# Patient Record
Sex: Female | Born: 1975 | Race: White | Hispanic: Yes | Marital: Married | Smoking: Never smoker
Health system: Southern US, Community
[De-identification: ages and names within clinical notes are randomized; demographics above are authoritative.]

## PROBLEM LIST (undated history)

## (undated) DIAGNOSIS — I10 Essential (primary) hypertension: Secondary | ICD-10-CM

## (undated) DIAGNOSIS — R140 Abdominal distension (gaseous): Secondary | ICD-10-CM

## (undated) DIAGNOSIS — D649 Anemia, unspecified: Secondary | ICD-10-CM

## (undated) DIAGNOSIS — K2970 Gastritis, unspecified, without bleeding: Secondary | ICD-10-CM

## (undated) HISTORY — DX: Abdominal distension (gaseous): R14.0

## (undated) HISTORY — PX: CHOLECYSTECTOMY: SHX55

## (undated) HISTORY — DX: Anemia, unspecified: D64.9

## (undated) HISTORY — DX: Essential (primary) hypertension: I10

---

## 2011-11-20 DIAGNOSIS — R079 Chest pain, unspecified: Secondary | ICD-10-CM

## 2013-04-16 ENCOUNTER — Encounter (HOSPITAL_COMMUNITY): Payer: Self-pay | Admitting: Emergency Medicine

## 2013-04-16 ENCOUNTER — Emergency Department (HOSPITAL_COMMUNITY)
Admission: EM | Admit: 2013-04-16 | Discharge: 2013-04-16 | Disposition: A | Discharge status: Home / Self Care | Payer: Self-pay | Attending: Emergency Medicine | Admitting: Emergency Medicine

## 2013-04-16 ENCOUNTER — Emergency Department (HOSPITAL_COMMUNITY): Payer: Self-pay

## 2013-04-16 DIAGNOSIS — R141 Gas pain: Secondary | ICD-10-CM | POA: Insufficient documentation

## 2013-04-16 DIAGNOSIS — Z79899 Other long term (current) drug therapy: Secondary | ICD-10-CM | POA: Insufficient documentation

## 2013-04-16 DIAGNOSIS — Z9089 Acquired absence of other organs: Secondary | ICD-10-CM | POA: Insufficient documentation

## 2013-04-16 DIAGNOSIS — R142 Eructation: Secondary | ICD-10-CM | POA: Insufficient documentation

## 2013-04-16 DIAGNOSIS — R143 Flatulence: Secondary | ICD-10-CM

## 2013-04-16 DIAGNOSIS — K3189 Other diseases of stomach and duodenum: Secondary | ICD-10-CM | POA: Insufficient documentation

## 2013-04-16 DIAGNOSIS — R1013 Epigastric pain: Secondary | ICD-10-CM

## 2013-04-16 HISTORY — DX: Gastritis, unspecified, without bleeding: K29.70

## 2013-04-16 LAB — CBC WITH DIFFERENTIAL/PLATELET
Basophils Absolute: 0 10*3/uL (ref 0.0–0.1)
Basophils Relative: 0 % (ref 0–1)
EOS PCT: 4 % (ref 0–5)
Eosinophils Absolute: 0.2 10*3/uL (ref 0.0–0.7)
HCT: 31 % — ABNORMAL LOW (ref 36.0–46.0)
HEMOGLOBIN: 9.7 g/dL — AB (ref 12.0–15.0)
LYMPHS ABS: 1.2 10*3/uL (ref 0.7–4.0)
LYMPHS PCT: 22 % (ref 12–46)
MCH: 25.2 pg — AB (ref 26.0–34.0)
MCHC: 31.3 g/dL (ref 30.0–36.0)
MCV: 80.5 fL (ref 78.0–100.0)
Monocytes Absolute: 0.4 10*3/uL (ref 0.1–1.0)
Monocytes Relative: 7 % (ref 3–12)
NEUTROS PCT: 67 % (ref 43–77)
Neutro Abs: 3.6 10*3/uL (ref 1.7–7.7)
PLATELETS: 333 10*3/uL (ref 150–400)
RBC: 3.85 MIL/uL — AB (ref 3.87–5.11)
RDW: 16.5 % — ABNORMAL HIGH (ref 11.5–15.5)
WBC: 5.3 10*3/uL (ref 4.0–10.5)

## 2013-04-16 LAB — COMPREHENSIVE METABOLIC PANEL
ALT: 11 U/L (ref 0–35)
AST: 17 U/L (ref 0–37)
Albumin: 4 g/dL (ref 3.5–5.2)
Alkaline Phosphatase: 60 U/L (ref 39–117)
BUN: 8 mg/dL (ref 6–23)
CO2: 22 meq/L (ref 19–32)
Calcium: 8.7 mg/dL (ref 8.4–10.5)
Chloride: 104 mEq/L (ref 96–112)
Creatinine, Ser: 0.55 mg/dL (ref 0.50–1.10)
GFR calc Af Amer: >90 mL/min (ref >90)
Glucose, Bld: 90 mg/dL (ref 70–99)
POTASSIUM: 3.9 meq/L (ref 3.7–5.3)
Sodium: 139 mEq/L (ref 137–147)
Total Bilirubin: 0.3 mg/dL (ref 0.3–1.2)
Total Protein: 7.3 g/dL (ref 6.0–8.3)

## 2013-04-16 LAB — LIPASE, BLOOD: LIPASE: 32 U/L (ref 11–59)

## 2013-04-16 MED ORDER — SUCRALFATE 1 G PO TABS: 1.0000 g | ORAL_TABLET | Freq: Three times a day (TID) | ORAL | Status: DC

## 2013-04-16 MED ORDER — PANTOPRAZOLE SODIUM 40 MG PO TBEC: 40.0000 mg | DELAYED_RELEASE_TABLET | Freq: Once | ORAL | Status: DC

## 2013-04-16 MED ORDER — SIMETHICONE 40 MG/0.6ML PO SUSP
40.0000 mg | Freq: Once | ORAL | Status: AC
  Administered 2013-04-16: 40 mg via ORAL
  Filled 2013-04-16: qty 0.6

## 2013-04-16 MED ORDER — RANITIDINE HCL 150 MG PO CAPS: 150.0000 mg | ORAL_CAPSULE | Freq: Every day | ORAL | Status: DC

## 2013-04-16 NOTE — Discharge Instructions (Signed)
Follow up with [facility]. Call office and make appointment as soon as you can. Take medications as directed. Return to ED should you develop blood in your stools, become progressively weak, dizziness, or fatigued.    Emergency Department Resource Guide 1) Find a Doctor and Pay Out of Pocket Although you won't have to find out who is covered by your insurance plan, it is a good idea to ask around and get recommendations. You will then need to call the office and see if the doctor you have chosen will accept you as a new patient and what types of options they offer for patients who are self-pay. Some doctors offer discounts or will set up payment plans for their patients who do not have insurance, but you will need to ask so you aren't surprised when you get to your appointment.  2) Contact Your Local Health Department Not all health departments have doctors that can see patients for sick visits, but many do, so it is worth a call to see if yours does. If you don't know where your local health department is, you can check in your phone book. The CDC also has a tool to help you locate your state's health department, and many state websites also have listings of all of their local health departments.  3) Find a Walk-in Clinic If your illness is not likely to be very severe or complicated, you may want to try a walk in clinic. These are popping up all over the country in pharmacies, drugstores, and shopping centers. They're usually staffed by nurse practitioners or physician assistants that have been trained to treat common illnesses and complaints. They're usually fairly quick and inexpensive. However, if you have serious medical issues or chronic medical problems, these are probably not your best option.  No Primary Care Doctor: - Call Health Connect at  [phone] - they can help you locate a primary care doctor that  accepts your insurance, provides certain services, etc. - Physician  Referral Service- [phone]  Chronic Pain Problems: Organization         Address  Phone   Notes  [facility]  [phone] Patients need to be referred by their primary care doctor.   Medication Assistance: Organization         Address  Phone   Notes  [facility] Medication [facility] [address] [place], [place] [id number] [phone] --Must be a resident of [place] -- Must have NO insurance coverage whatsoever (no Medicaid/ Medicare, etc.) -- The pt. MUST have a primary care doctor that directs their care regularly and follows them in the community   MedAssist  [phone]   [employer]  [phone]    Agencies that provide inexpensive medical care: Organization         Address  Phone   Notes  [facility]  [phone]   [facility]    [phone]   [facility] [address] [place], [place] [address] [phone]   [facility] [address], [place] [phone]   [facility]    [phone]   [facility]    [phone]   [facility]  [address], [place] Phone:  [phone], Fax:  [phone] Hours of Operation:  9 am - 6 pm, M-F.  Also accepts Medicaid/Medicare and self-pay.  [facility] for [place], [address], [place] Phone: [phone], Fax: [phone]. Hours of Operation:  8:30 am - 5:30 pm, M-F.  Also accepts Medicaid and self-pay.  [facility] [place] [address] [place] Phone: [phone]   [place], [place], [place] [phone], Ext. [phone] Mondays & Thursdays: 7-9 AM.  First 15 patients are seen on a first come, first serve basis.    [place] Providers:  Organization         Address  Phone   Notes  [facility] [address], [place] [phone] Also accepts self-pay patients.  [facility] [address], [place]  [phone]   [place], [address], [place] [phone]   [facility] [address] [place] [phone]   Lucianne Lei [address], [place]   [phone] Only accepts [place] Access [place] patients after they have their name applied to their card.   Self-Pay (no insurance) in [facility]:  Organization         Address  Phone   Notes  Sickle Cell Patients, [facility] [place] [phone]   [facility] [place] [phone]   [facility] Interior  [place], [place], [place] [phone]   [facility]/Dr. Osei-Bonsu  [address] [place] [address], [place] [phone] Phone number for both [place] and [place] locations is the same.  [facility] [address], [place] [phone]   [facility] [address] [place] or [address] [phone]   [facility] [address], [place] [phone], phone; [phone], fax Sees patients 1st and 3rd Saturday of every month.  Must not qualify for public or private insurance (i.e. Medicaid, Medicare, [place] Health Choice, Veterans' Benefits)  Household income should be no more than 200% of the poverty level The clinic cannot treat you if you are pregnant or think you are pregnant  Sexually transmitted diseases are not treated at the clinic.    Dental Care: Organization         Address  Phone  Notes  [facility] Department of [facility] [phone] Accepts children up to age 7 who are enrolled  in [place] or [place]; pregnant women with a Medicaid card; and children who have applied for Medicaid or [place] Health Choice, but were declined, whose parents can pay a reduced fee at time of service.  [facility] Department of [facility]  [address], [place] [phone] Accepts children up to age 36 who are enrolled in [place] or [place]; pregnant women with a Medicaid card; and children who have applied for Medicaid or [place] Health Choice, but were declined, whose parents can pay a reduced fee at time of service.  [facility] Adult [facility] Access PROGRAM  [place] [phone] Patients are seen by appointment only. Walk-ins are not accepted. [place] will see patients 32 years of age and older. Monday - Tuesday (8am-5pm) Most Wednesdays (8:30-5pm) $30 per visit, cash  only  [facility] Adult Dental Access PROGRAM  [address], [facility] [phone] Patients are seen by appointment only. Walk-ins are not accepted. [place] will see patients 68 years of age and older. One Wednesday Evening (Monthly: Volunteer Based).  $30 per visit, cash only  [place]  [phone] for adults; Children under age 4, call Graduate Pediatric Dentistry at [phone]. Children aged 86-14, please call [phone] to request a pediatric application.  Dental services are provided in all areas of dental care including fillings, crowns and bridges, complete and partial dentures, implants, gum treatment, root canals, and extractions. Preventive care is also provided. Treatment is provided to both adults and children. Patients are selected via a lottery and there is often a waiting list.   [facility] [address], [place]  [phone] [email or web address]   [facility] [address] [place], [place] [phone], Ext. [phone] Second and Fourth Thursday of each month, opens at  6:30 AM; Clinic ends at 9 AM.  Patients are seen on a first-come first-served basis, and a limited number are seen during each clinic.   [facility]  [address] Hillard Danker [place], [place] [phone]   Eligibility Requirements You must have lived in [place], [place], or [place] counties for at least the last three months.   You cannot be eligible for state or federal sponsored [employer], including [employer], [place], or [employer].   You generally cannot be eligible for healthcare insurance through your employer.    How to apply: Eligibility screenings are held every Tuesday and Wednesday afternoon from 1:00 pm until 4:00 pm. You do not need an appointment for the interview!  [facility] [address] [place], [place]   [place]  [place] Department  [place]  [phone]    Behavioral Health Resources in the Community: Intensive Outpatient Programs Organization         Address  Phone  Notes  [place]. [address], [place], [place] [phone]   [facility] Outpatient [address] [place], [place]   ADS: Alcohol & Drug Svcs [address] [place], [place]   [place] [address],  [place], [place] or [phone]   Substance Abuse Resources Organization         Address  Phone  Notes  Alcohol and Drug Services  [phone]   [place]  [phone]   The [place]   Chinita Pester  [phone]   Residential & Outpatient Substance Abuse Program  [phone]   Psychological Services Organization         Address  Phone  Notes  [facility]  [place]  [phone]   [place] [address], [place] or [phone]    Mobile Crisis Teams Organization         Address  Phone  Notes  [facility], Mobile Crisis Care Unit  [phone]   [facility]ive Psychotherapeutic Services  [address] [place], [place]   [place] Levels [address], [place] [phone]    Self-Help/Support Groups Organization         Address  Phone  Notes  [facility] of [place] - variety of support groups  [place] Call for more information  Narcotics Anonymous (NA), [facility]aring [facility] [address], [employer] [place]  2 meetings at this location   Special educational needs teacher         Address  Phone  Notes  ASAP Residential Treatment [place],    [place]  [phone]   [facility]  [address], [place] [phone], [place], [place]   [place], [place] [phone] Admissions: 8am-3pm M-F  Incentives Substance [place] [address],    [place], [place] [phone]   [facility] [address] [place], [place], [place]   [facility] [address],  [place], [place]   Insight Programs - Intensive Outpatient [address] Kristeen [address], [place], [place]   [facility] ([place].) [place].,  [place], [place] [phone] or [phone]   [facility]idential Treatment Services (RTS) [address] [place], [place] Accepts Medicaid  [place] [address]  [place] [phone] Substance Abuse/Addiction Treatment   [facility] Organization         Address  Phone  Notes  [facility]  [phone]   Domenic Schwab, PhD [address] Arlis Porta [place], [place]   [phone] or [phone]   [place], [place] [phone]   [facility]  [address] [place], [place] [phone] Insurance/Medicaid/sponsorship through [facility] and Families [address] [place]                                    [place], [place] [phone] [place] [address], [place] [phone]    Dr. Adele Schilder  [phone]   [facility] Dept. 1) [address], [place] 2) [place] 3)  [place] 65, [place] [phone]  [phone]   [place] [phone] or [phone] (After Hours)

## 2013-04-16 NOTE — ED Provider Notes (Signed)
CSN: [id number]     Arrival date & time 04/16/13  8295 History   First MD Initiated Contact with Patient 04/16/13 1004     Chief Complaint  Patient presents with  . Abdominal Pain   (Consider location/radiation/quality/duration/timing/severity/associated sxs/prior Treatment) Patient is a 38 y.o. female presenting with abdominal pain. The history is provided by the patient. The history is limited by a language barrier. A language interpreter was used.  Abdominal Pain Pain location:  LUQ Pain quality: bloating   Pain radiates to:  Does not radiate Pain severity:  No pain Onset quality:  Gradual Duration: 3 months. Progression:  Unchanged Relieved by:  Nothing Associated symptoms: belching   Associated symptoms: no chest pain, no chills, no diarrhea, no dysuria, no fever, no hematochezia, no hematuria, no melena, no nausea, no shortness of breath and no vomiting   Patient admits to being dx with Gastritis by her doctor and started on Omeprazole. States symptoms have not improved. Denies any pain just complains of bloating and repetitive belching and fullness in her stomach. Denies any other medication use. Patient PMH significant for Cholecystectomy.  Past Medical History  Diagnosis Date  . Gastritis    History reviewed. No pertinent past surgical history. No family history on file. History  Substance Use Topics  . Smoking status: Never Smoker   . Smokeless tobacco: Never Used  . Alcohol Use: Yes     Comment: occasinally   OB History   Grav Para Term Preterm Abortions TAB SAB Ect Mult Living                 Review of Systems  Constitutional: Negative for fever and chills.  Respiratory: Negative for shortness of breath.   Cardiovascular: Negative for chest pain.  Gastrointestinal: Positive for abdominal distention. Negative for nausea, vomiting, abdominal pain, diarrhea, melena and hematochezia.  Genitourinary: Negative for dysuria and hematuria.  All other systems reviewed  and are negative.    Allergies  Other  Home Medications   Current Outpatient Rx  Name  Route  Sig  Dispense  Refill  . omeprazole (PRILOSEC) 40 MG capsule   Oral   Take 40 mg by mouth daily.         . ranitidine (ZANTAC) 150 MG capsule   Oral   Take 1 capsule (150 mg total) by mouth daily.   30 capsule   0   . sucralfate (CARAFATE) 1 G tablet   Oral   Take 1 tablet (1 g total) by mouth 4 (four) times daily -  with meals and at bedtime.   90 tablet   0    BP 156/85  Pulse 91  Temp(Src) 97.9 F (36.6 C)  Resp 18  Wt 143 lb (64.864 kg)  SpO2 99%  LMP 04/16/2013 Physical Exam  Nursing note and vitals reviewed. Constitutional: She is oriented to person, place, and time. She appears well-developed and well-nourished. No distress.  HENT:  Head: Normocephalic and atraumatic.  Eyes: Conjunctivae and EOM are normal. No scleral icterus.  Neck: Normal range of motion. Neck supple.  Cardiovascular: Normal rate and regular rhythm.  Exam reveals no gallop and no friction rub.   No murmur heard. Pulmonary/Chest: Effort normal and breath sounds normal. No respiratory distress. She has no wheezes. She has no rales.  Abdominal: Soft. Bowel sounds are normal. She exhibits no distension and no mass. There is no tenderness. There is no rigidity, no rebound, no guarding, no tenderness at McBurney's point and negative Murphy's  sign.  Scars present in RUQ consistent with prior laproscopic Cholecystectomy.   Musculoskeletal: Normal range of motion. She exhibits no edema.  Neurological: She is alert and oriented to person, place, and time.  Skin: Skin is warm and dry. She is not diaphoretic.  Psychiatric: She has a normal mood and affect. Her behavior is normal.    ED Course  Procedures (including critical care time) Labs Review Labs Reviewed  CBC WITH DIFFERENTIAL - Abnormal; Notable for the following:    RBC 3.85 (*)    Hemoglobin 9.7 (*)    HCT 31.0 (*)    MCH 25.2 (*)     RDW 16.5 (*)    All other components within normal limits  COMPREHENSIVE METABOLIC PANEL  LIPASE, BLOOD   Imaging Review Dg Abd Acute W/chest  04/16/2013   CLINICAL DATA:  Dyspepsia, bloating  EXAM: ACUTE ABDOMEN SERIES (ABDOMEN 2 VIEW & CHEST 1 VIEW)  COMPARISON:  None.  FINDINGS: There is no evidence of dilated bowel loops or free intraperitoneal air. No radiopaque calculi or other significant radiographic abnormality is seen. Heart size and mediastinal contours are within normal limits. Both lungs are clear. Previous cholecystectomy noted.  IMPRESSION: Negative abdominal radiographs.  No acute cardiopulmonary disease.   Electronically Signed   By: Ruel Favorsrevor  Shick M.D.   On: 04/16/2013 12:11    EKG Interpretation   None       MDM   1. Dyspepsia    Patient does not appear to have an acute abdomen. Plan films not consistent with obstruction or acute cardiopulmonary disease.   Patient anemic with hgb of 9.7. No prior labs available for comparison. Patient asymptomatic currently. Additional labs unremarkable. Cannot rule out peptic ulcer at this time. Plan to treat patient for ulcer until can be seen by GI.   Discussed labs, and exam findings with patient. Advised follow up with [facility] ASAP. Patient provided resource guide for reference. Recommend return to ED should patient  develop blood in stools, become progressively weak, dizziness, or fatigued. Interpreter phone used to ensure patient's understanding of instructions. Patient agrees with plan. Provided informational handout in spanish. Discharged in good condition.   Meds given in ED:  Medications  simethicone (MYLICON) 40 MG/0.6ML suspension 40 mg (40 mg Oral Given 04/16/13 1113)    Discharge Medication List as of 04/16/2013 12:33 PM    START taking these medications   Details  ranitidine (ZANTAC) 150 MG capsule Take 1 capsule (150 mg total) by mouth daily., Starting 04/16/2013, Until Discontinued, Print    sucralfate  (CARAFATE) 1 G tablet Take 1 tablet (1 g total) by mouth 4 (four) times daily -  with meals and at bedtime., Starting 04/16/2013, Until Discontinued, Print            Rudene AndaJacob Gray Deetya Drouillard, PA-C 04/16/13 2050

## 2013-04-16 NOTE — ED Notes (Signed)
Patient is experiencing 'abdominal swelling' Patient reports that it is not painful. Patient reports that this feeling makes her not want to eat because when she does she has a lot of gas and belching. Patient denies n/v/d. Patient states she has a hx of bacterial gastritis 8 years ago. Denies hematochezia.

## 2013-04-17 NOTE — ED Provider Notes (Signed)
Medical screening examination/treatment/procedure(s) were performed by non-physician practitioner and as supervising physician I was immediately available for consultation/collaboration.     Geoffery Lyonsouglas Teshaun Olarte, MD 04/17/13 [phone]

## 2013-04-25 ENCOUNTER — Encounter: Payer: Self-pay | Admitting: Gastroenterology

## 2013-05-02 ENCOUNTER — Other Ambulatory Visit (INDEPENDENT_AMBULATORY_CARE_PROVIDER_SITE_OTHER): Payer: Self-pay

## 2013-05-02 ENCOUNTER — Encounter: Payer: Self-pay | Admitting: Gastroenterology

## 2013-05-02 ENCOUNTER — Ambulatory Visit (INDEPENDENT_AMBULATORY_CARE_PROVIDER_SITE_OTHER): Payer: Self-pay | Admitting: Gastroenterology

## 2013-05-02 VITALS — BP 108/78 | HR 84 | Ht 58.25 in | Wt 140.5 lb

## 2013-05-02 DIAGNOSIS — R143 Flatulence: Secondary | ICD-10-CM

## 2013-05-02 DIAGNOSIS — R142 Eructation: Secondary | ICD-10-CM

## 2013-05-02 DIAGNOSIS — R141 Gas pain: Secondary | ICD-10-CM

## 2013-05-02 DIAGNOSIS — D649 Anemia, unspecified: Secondary | ICD-10-CM

## 2013-05-02 DIAGNOSIS — R140 Abdominal distension (gaseous): Secondary | ICD-10-CM

## 2013-05-02 DIAGNOSIS — R1012 Left upper quadrant pain: Secondary | ICD-10-CM

## 2013-05-02 LAB — FOLATE

## 2013-05-02 LAB — FERRITIN: Ferritin: 2.3 ng/mL — ABNORMAL LOW (ref 10.0–291.0)

## 2013-05-02 LAB — IBC PANEL
Iron: 23 ug/dL — ABNORMAL LOW (ref 42–145)
Saturation Ratios: 5.3 % — ABNORMAL LOW (ref 20.0–50.0)
Transferrin: 308.9 mg/dL (ref 212.0–360.0)

## 2013-05-02 LAB — VITAMIN B12: Vitamin B-12: 76 pg/mL — ABNORMAL LOW (ref 211–911)

## 2013-05-02 MED ORDER — PANTOPRAZOLE SODIUM 40 MG PO TBEC: 40.0000 mg | DELAYED_RELEASE_TABLET | Freq: Every day | ORAL | Status: DC

## 2013-05-02 NOTE — Patient Instructions (Addendum)
Your physician has requested that you go to the basement for the following lab work before leaving today: Anemia panel.  Stop taking your omeprazole.   We have sent the following medications to your pharmacy for you to pick up at your convenience: pantoprazole.  You have been given a Low gas diet.   Use over the counter Gas-x four times a day as needed for gas and bloating.   Dieta para el reflujo gastroesofgico - Adultos  (Diet for Gastroesophageal Reflux Disease, Adult)  El reflujo (reflujo cido) ocurre cuando el cido del estmago pasa al esfago. Cuando el cido entra en contacto con el esfago, el cido provoca dolor e irritacin (inflamacin) en el esfago. Cuando el reflujo ocurre a menudo o es tan grave que causa dao en el esfago, se denomina enfermedad por reflujo gastroesofgico (ERGE). La terapia nutricional puede ayudar a Acupuncturistaliviar el malestar de la [place]ERGE.  ALIMENTOS O BEBIDAS QUE DEBE EVITAR O LIMITAR   Fumar o consumir tabaco. La nicotina es uno de los estimulantes ms potentes en la produccin de cido en el tracto gastrointestinal.  Caf y t negro con cafena o descafeinado.  Gaseosas comunes o bajas caloras o bebidas energizantes (las gaseosas sin cafena estn permitidas).   Especias picantes, como la pimienta negra, pimienta blanca, pimienta roja, pimienta de cayena, curry en polvo,y [place]ub[place]ile en polvo.  Menta y mentol.  Chocolate.  Alimentos con alto contenido de grasas, incluyendo las carnes y comidas fritas. El agregado de Morsegrasas extra, por ejemplo aceite, Brightwoodmanteca, aderezo para ensaladas y nueces. Limite estos alimentos a menos de 8 cucharaditas por da.  Las frutas y verduras si no son toleradas, tales como frutas ctricas o tomates.  El alcohol.  Todo alimento que agrave el trastorno. Si tiene dudas relacionadas con la dieta, comunquese con el profesional que lo asiste o con un nutricionista matriculado.  OTROS FACTORES QUE PUEDEN ALIVIAR EL ERGE SON:    Comer lentamente, en un clima distendido.  Hacer 5 o 6 comidas pequeas por da en vez de tres grandes.  Suprimir por un CBS Corporationtiempo los alimentos que causen problemas.  No acostarse hasta despus de 3 horas de haber comido.  Mantener la cabeza elevada 6 a 9 pulgadas (15 a 23 cm) usando una cua de espuma o bloques debajo de las patas de la cama. Si permanece en una postura plana har empeorar los sntomas.  Mantngase fsicamente activo. Perder peso puede ser de ayuda para reducir el Asbury [employer] Groupreflujo en los adultos obesos o con sobrepeso.  Use ropas sueltas. EJEMPLO DE UN PLAN DE ALIMENTACIN  Este plan de alimentacin consiste en aproximadamente 2 000 caloras, segn las guas de alimentacin de https://www.bernard.org/ChooseMyPlate.gov.  Desayuno   taza de avena cocida.  1 porcin de fresas.  1 taza de PPG Industriesleche descremada.  1 oz de almendras. Colacin  1 taza de rebanadas de pepino.  6 oz de yogur (elaborado con WPS Resourcesleche con bajo contenido de grasas o descremada). Almuerzo:  2 rebanada de pan integral.  2 oz de rebanadas de pavo.  2 cucharaditas de mayonesa.  1 taza de arndanos.  1 taza de guisantes. Colacin  6 crackers integrales.  1 oz ( 28 g) de queso en hebras. Cena   taza de arroz integral.  1 taza de vegetales variados.  1 cucharadita de aceite de oliva.  3 oz ( 84 g) de pescado grill. Document Released: 01/01/2005 Document Revised: 06/16/2011 [facility] Patient Information 2014 [employer], [place].  cc: [facility]

## 2013-05-02 NOTE — Progress Notes (Signed)
    History of Present Illness: This is a 38 year old Spanish speaking female here with an interpreter. All history is obtained through the interpreter. She [place] care physician in [place], probably the [facility]. She relates a 2 month history of left upper quadrant discomfort with gas and belching. She also has frequent heartburn. She was placed on omeprazole and her heartburn was relieved however her gas belching and mild left upper quadrant discomfort have not improved. She states she had similar problems 8 years ago when living in [place]. She states she had an endoscopy and H. pylori was diagnosed and treated. She notes a 10 pound weight loss as she is clearly eating less with her current symptoms. She was recently evaluated at [facility] blood work was unremarkable except for anemia with a hemoglobin of 9.7. Denies constipation, diarrhea, change in stool caliber, melena, hematochezia, nausea, vomiting, dysphagia, chest pain.  Current Medications, Allergies, Past Medical History, Past Surgical History, Family History and Social History were reviewed in [facility] electronic medical record.  Physical Exam: General: Well developed , well nourished, no acute distress Head: Normocephalic and atraumatic Eyes:  sclerae anicteric, EOMI Ears: Normal auditory acuity Mouth: No deformity or lesions Lungs: Clear throughout to auscultation Heart: Regular rate and rhythm; no murmurs, rubs or bruits Abdomen: Soft, minimal left upper quadrant tenderness without rebound or guarding and non distended. No masses, hepatosplenomegaly or hernias noted. Normal Bowel sounds Musculoskeletal: Symmetrical with no gross deformities  Pulses:  Normal pulses noted Extremities: No clubbing, cyanosis, edema or deformities noted Neurological: Alert oriented x 4, grossly nonfocal Psychological:  Alert and cooperative. Normal mood and affect  Assessment and Recommendations:  1. LUQ  discomfort, gas, belching, GERD. Changed to pantoprazole 40 mg daily. Begin standard antireflux measures. Begin a low gas diet. Begin Gas-X 4 times a day when necessary. If her symptoms do not resolve consider further evaluation with abdominal imaging, stool Hemoccults and upper endoscopy.  2. Anemia. Iron, TIBC, ferritin, B12 and folate. Further followup with her primary physician.

## 2013-05-06 ENCOUNTER — Other Ambulatory Visit: Payer: Self-pay

## 2013-05-06 DIAGNOSIS — D649 Anemia, unspecified: Secondary | ICD-10-CM

## 2013-05-10 ENCOUNTER — Encounter: Payer: Self-pay | Admitting: Gastroenterology

## 2013-05-18 ENCOUNTER — Ambulatory Visit: Payer: Self-pay | Admitting: Gastroenterology

## 2013-05-18 ENCOUNTER — Encounter: Payer: Self-pay | Admitting: Gastroenterology

## 2013-05-18 ENCOUNTER — Telehealth: Payer: Self-pay | Admitting: Gastroenterology

## 2013-05-18 NOTE — Telephone Encounter (Signed)
MAILED LETTER °

## 2013-05-18 NOTE — Telephone Encounter (Signed)
Pt was a no show

## 2013-06-14 ENCOUNTER — Ambulatory Visit (AMBULATORY_SURGERY_CENTER): Payer: Self-pay

## 2013-06-14 VITALS — Ht 59.0 in | Wt 136.8 lb

## 2013-06-14 DIAGNOSIS — R141 Gas pain: Secondary | ICD-10-CM

## 2013-06-14 DIAGNOSIS — R143 Flatulence: Secondary | ICD-10-CM

## 2013-06-14 DIAGNOSIS — R140 Abdominal distension (gaseous): Secondary | ICD-10-CM

## 2013-06-14 DIAGNOSIS — R142 Eructation: Secondary | ICD-10-CM

## 2013-06-14 DIAGNOSIS — D649 Anemia, unspecified: Secondary | ICD-10-CM

## 2013-06-14 MED ORDER — MOVIPREP 100 G PO SOLR: ORAL | Status: DC

## 2013-06-14 NOTE — Progress Notes (Signed)
Spoke with Denny PeonErin, pharmacist) at [facility] regarding side effects with Moviprep and pt being allergic to Zantac.Per Denny PeonErin, pt is able to take Moviprep. No warnings noted with the 2 medications being taken.  Amil Amen (interpretor) was with the pt at the pre-visit and explained prep instructions and helped answer her questions. An interpretor and driver will be with the pt on 06/28/13

## 2013-06-15 ENCOUNTER — Other Ambulatory Visit: Payer: Self-pay

## 2013-06-15 DIAGNOSIS — D649 Anemia, unspecified: Secondary | ICD-10-CM

## 2013-06-16 LAB — HEMOCCULT SLIDES (X 3 CARDS)
Fecal Occult Blood: NEGATIVE
OCCULT 1: NEGATIVE
OCCULT 2: NEGATIVE
OCCULT 3: NEGATIVE
OCCULT 4: NEGATIVE
OCCULT 5: NEGATIVE

## 2013-06-28 ENCOUNTER — Ambulatory Visit (AMBULATORY_SURGERY_CENTER): Payer: Self-pay | Admitting: Gastroenterology

## 2013-06-28 ENCOUNTER — Encounter: Payer: Self-pay | Admitting: Gastroenterology

## 2013-06-28 VITALS — BP 130/85 | HR 77 | Temp 99.5°F | Resp 17

## 2013-06-28 DIAGNOSIS — D649 Anemia, unspecified: Secondary | ICD-10-CM

## 2013-06-28 DIAGNOSIS — D509 Iron deficiency anemia, unspecified: Secondary | ICD-10-CM

## 2013-06-28 DIAGNOSIS — K2940 Chronic atrophic gastritis without bleeding: Secondary | ICD-10-CM

## 2013-06-28 DIAGNOSIS — K219 Gastro-esophageal reflux disease without esophagitis: Secondary | ICD-10-CM

## 2013-06-28 DIAGNOSIS — R1012 Left upper quadrant pain: Secondary | ICD-10-CM

## 2013-06-28 MED ORDER — SODIUM CHLORIDE 0.9 % IV SOLN: 500.0000 mL | INTRAVENOUS | Status: DC

## 2013-06-28 NOTE — Op Note (Signed)
[facility] [address] [place], [id number]   ENDOSCOPY PROCEDURE REPORT  PATIENT: Alexandra CorkRodrigues, Alexandra  MR#: [id number] BIRTHDATE: 06/25/1975 , 37  yrs. old GENDER: Female ENDOSCOPIST: Meryl DareMalcolm T Stark, MD, [facility] REFERRED BY:  Cathren LaineKevin Steinl, M.D. PROCEDURE DATE:  06/28/2013 PROCEDURE:  EGD w/ biopsy ASA CLASS:     Class II INDICATIONS:  abdominal pain in upper left quadrant.   Iron deficiency anemia. MEDICATIONS: MAC sedation, administered by CRNA and propofol (Diprivan) 200mg  IV TOPICAL ANESTHETIC: none DESCRIPTION OF PROCEDURE: After the risks benefits and alternatives of the procedure were thoroughly explained, informed consent was obtained.  The LB [id number] endoscope was introduced through the mouth and advanced to the second portion of the duodenum. Without limitations.  The instrument was slowly withdrawn as the mucosa was fully examined.  ESOPHAGUS: The mucosa of the esophagus appeared normal. STOMACH: Atrophic gastritis was found in the gastric body and gastric fundus.  Multiple biopsies were performed.   The stomach otherwise appeared normal. DUODENUM: The duodenal mucosa showed no abnormalities in the bulb and second portion of the duodenum.  Retroflexed views revealed no abnormalities.     The scope was then withdrawn from the patient and the procedure completed.  COMPLICATIONS: There were no complications.  ENDOSCOPIC IMPRESSION: 1.    Atrophic gastritis in the gastric body and gastric fundus; multiple biopsies 2 .   The EGD otherwise appeared normal  RECOMMENDATIONS: 1.  Anti-reflux regimen 2.  Schedule a colonoscopy. Miralax split dose prep with Reglan 10 mg 30 min before both doses 3.  Schedule abdominal ultrasound 4.  Await pathology results 5.  Carafate liquid 1 g tid, 3 months of refills, Bentylk 10 mg tid, 3 months of refills, continue Gas-X qid  eSigned:  Meryl DareMalcolm T Stark, MD, [facility] 06/28/2013 2:31 PM

## 2013-06-28 NOTE — Progress Notes (Signed)
Called to room to assist during endoscopic procedure.  Patient ID and intended procedure confirmed with present staff. Received instructions for my participation in the procedure from the performing physician.  

## 2013-06-28 NOTE — Progress Notes (Signed)
Lidocaine-40mg IV prior to Propofol InductionPropofol given over incremental dosages 

## 2013-06-28 NOTE — Patient Instructions (Addendum)
YOU HAD AN ENDOSCOPIC PROCEDURE TODAY AT THE [facility]: Refer to the procedure report that was given to you for any specific questions about what was found during the examination.  If the procedure report does not answer your questions, please call your gastroenterologist to clarify.  If you requested that your care partner not be given the details of your procedure findings, then the procedure report has been included in a sealed envelope for you to review at your convenience later.  YOU SHOULD EXPECT: Some feelings of bloating in the abdomen. Passage of more gas than usual.  Walking can help get rid of the air that was put into your GI tract during the procedure and reduce the bloating. If you had a lower endoscopy (such as a colonoscopy or flexible sigmoidoscopy) you may notice spotting of blood in your stool or on the toilet paper. If you underwent a bowel prep for your procedure, then you may not have a normal bowel movement for a few days.  DIET: Your first meal following the procedure should be a light meal and then it is ok to progress to your normal diet.  A half-sandwich or bowl of soup is an example of a good first meal.  Heavy or fried foods are harder to digest and may make you feel nauseous or bloated.  Likewise meals heavy in dairy and vegetables can cause extra gas to form and this can also increase the bloating.  Drink plenty of fluids but you should avoid alcoholic beverages for 24 hours.  ACTIVITY: Your care partner should take you home directly after the procedure.  You should plan to take it easy, moving slowly for the rest of the day.  You can resume normal activity the day after the procedure however you should NOT DRIVE or use heavy machinery for 24 hours (because of the sedation medicines used during the test).    SYMPTOMS TO REPORT IMMEDIATELY: A gastroenterologist can be reached at any hour.  During normal business hours, 8:30 AM to 5:00 PM Monday through Friday,  call [phone].  After hours and on weekends, please call the GI answering service at [phone] who will take a message and have the physician on call contact you.   Following upper endoscopy (EGD)  Vomiting of blood or coffee ground material  New chest pain or pain under the shoulder blades  Painful or persistently difficult swallowing  New shortness of breath  Fever of 100F or higher  Black, tarry-looking stools  FOLLOW UP: If any biopsies were taken you will be contacted by phone or by letter within the next 1-3 weeks.  Call your gastroenterologist if you have not heard about the biopsies in 3 weeks.  Our staff will call the home number listed on your records the next business day following your procedure to check on you and address any questions or concerns that you may have at that time regarding the information given to you following your procedure. This is a courtesy call and so if there is no answer at the home number and we have not heard from you through the emergency physician on call, we will assume that you have returned to your regular daily activities without incident.  SIGNATURES/CONFIDENTIALITY: You and/or your care partner have signed paperwork which will be entered into your electronic medical record.  These signatures attest to the fact that that the information above on your After Visit Summary has been reviewed and is understood.  Full responsibility   of the confidentiality of this discharge information lies with you and/or your care-partner.   The 3rd floor staff will arrange an ultrasound for you.  Use Carafate liquid 1 Gm three times a day; Bentyl 10mg  three times a day and the samples instead of the gas x you had been using.  Please, try phazyme 1-2 after a meal.  Do not exceed 2 softgels in 24hrs.   Colonoscopy is scheduled for May 6th at 2pm.  Please be here at 1pm.   You previsit is July 29, 2013 at 4pm.

## 2013-06-29 ENCOUNTER — Other Ambulatory Visit: Payer: Self-pay

## 2013-06-29 ENCOUNTER — Telehealth: Payer: Self-pay | Admitting: *Deleted

## 2013-06-29 DIAGNOSIS — R1012 Left upper quadrant pain: Secondary | ICD-10-CM

## 2013-06-29 NOTE — Progress Notes (Signed)
Patient is scheduled for US per procedure report from 06/28/13 8:30.  She is aware to arrive at 8:15 for an 8:30 and be NPO after midnight

## 2013-06-29 NOTE — Telephone Encounter (Signed)
  Follow up Call-  Call back number 06/28/2013  Post procedure Call Back phone  # [phone]  Permission to leave phone message Yes   [facility]

## 2013-06-30 ENCOUNTER — Telehealth: Payer: Self-pay | Admitting: Gastroenterology

## 2013-06-30 NOTE — Telephone Encounter (Signed)
Left a message for patient to call back. 

## 2013-07-01 MED ORDER — DICYCLOMINE HCL 10 MG PO CAPS: ORAL_CAPSULE | ORAL | Status: AC

## 2013-07-01 MED ORDER — SUCRALFATE 1 GM/10ML PO SUSP: ORAL | Status: AC

## 2013-07-01 NOTE — Telephone Encounter (Signed)
Alexandra Carter spoke with patient and she states the Carafate and Bentyl rx was not sent to her pharmacy. Rx's sent as per procedure note. Patient also given instructions for ultrasound on 07/04/13 as per progress note.

## 2013-07-04 ENCOUNTER — Ambulatory Visit (HOSPITAL_COMMUNITY)
Admission: RE | Admit: 2013-07-04 | Discharge: 2013-07-04 | Disposition: A | Discharge status: Home / Self Care | Payer: Self-pay | Source: Ambulatory Visit | Attending: Gastroenterology | Admitting: Gastroenterology

## 2013-07-04 DIAGNOSIS — Z9089 Acquired absence of other organs: Secondary | ICD-10-CM | POA: Insufficient documentation

## 2013-07-04 DIAGNOSIS — R1012 Left upper quadrant pain: Secondary | ICD-10-CM | POA: Insufficient documentation

## 2013-07-10 ENCOUNTER — Encounter: Payer: Self-pay | Admitting: Gastroenterology

## 2013-08-05 ENCOUNTER — Ambulatory Visit (AMBULATORY_SURGERY_CENTER): Payer: Self-pay | Admitting: *Deleted

## 2013-08-05 VITALS — Ht <= 58 in | Wt 135.0 lb

## 2013-08-05 DIAGNOSIS — R143 Flatulence: Principal | ICD-10-CM

## 2013-08-05 DIAGNOSIS — R141 Gas pain: Secondary | ICD-10-CM

## 2013-08-05 DIAGNOSIS — R142 Eructation: Principal | ICD-10-CM

## 2013-08-05 MED ORDER — METOCLOPRAMIDE HCL 10 MG PO TABS: 10.0000 mg | ORAL_TABLET | ORAL | Status: DC

## 2013-08-05 NOTE — Progress Notes (Signed)
No egg or soy allergy. No anesthesia problems.  No home O2.  No diet meds.  

## 2013-08-10 ENCOUNTER — Encounter: Payer: Self-pay | Admitting: Gastroenterology

## 2013-09-01 ENCOUNTER — Encounter: Payer: Self-pay | Admitting: Gastroenterology

## 2013-09-01 ENCOUNTER — Ambulatory Visit (AMBULATORY_SURGERY_CENTER): Payer: Self-pay | Admitting: Gastroenterology

## 2013-09-01 VITALS — BP 158/91 | HR 85 | Temp 99.6°F | Resp 18 | Ht 59.0 in | Wt 135.0 lb

## 2013-09-01 DIAGNOSIS — D509 Iron deficiency anemia, unspecified: Secondary | ICD-10-CM

## 2013-09-01 DIAGNOSIS — K921 Melena: Secondary | ICD-10-CM

## 2013-09-01 MED ORDER — SODIUM CHLORIDE 0.9 % IV SOLN: 500.0000 mL | INTRAVENOUS | Status: DC

## 2013-09-01 NOTE — Op Note (Signed)
[facility] [address] [place], [address]   COLONOSCOPY PROCEDURE REPORT  PATIENT: Alexandra Carter  MR#: [id number] BIRTHDATE: 03/08/1976 , 37  yrs. old GENDER: Female ENDOSCOPIST: Meryl Dare, MD, [facility] REFERRED BY: PROCEDURE DATE:  09/01/2013 PROCEDURE:   Colonoscopy, diagnostic First Screening Colonoscopy - Avg.  risk and is 50 yrs.  old or older - No.  Prior Negative Screening - Now for repeat screening. N/A  History of Adenoma - Now for follow-up colonoscopy & has been > or = to 3 yrs.  N/A  Polyps Removed Today? No.  Recommend repeat exam, <10 yrs? No. ASA CLASS:   Class II INDICATIONS:Iron Deficiency Anemia and hematochezia. MEDICATIONS: MAC sedation, administered by CRNA and propofol (Diprivan) 330mg  IV DESCRIPTION OF PROCEDURE:   After the risks benefits and alternatives of the procedure were thoroughly explained, informed consent was obtained.  A digital rectal exam revealed no abnormalities of the rectum.   The LB [id number]  endoscope was introduced through the anus and advanced to the cecum, which was identified by both the appendix and ileocecal valve. No adverse events experienced.   The quality of the prep was good, using MoviPrep  The instrument was then slowly withdrawn as the colon was fully examined.  COLON FINDINGS: A normal appearing cecum, ileocecal valve, and appendiceal orifice were identified.  The ascending, hepatic flexure, transverse, splenic flexure, descending, sigmoid colon and rectum appeared unremarkable.  No polyps or cancers were seen. Retroflexed views revealed small internal hemorrhoids. The time to cecum=3 minutes 14 seconds.  Withdrawal time=9 minutes 50 seconds. The scope was withdrawn and the procedure completed. COMPLICATIONS: There were no complications.  ENDOSCOPIC IMPRESSION: 1.  Normal colon 2.  Small internal hemorrhoids  RECOMMENDATIONS: 1. You should continue to follow colorectal cancer  screening guidelines for "routine risk" patients with a repeat colonoscopy in at 50. 2. intrinsic factor Ab, parietal cell Ab today 3. Follow up with PCP for B12 deficiency and Fe deficiency  eSigned:  Meryl Dare, MD, [facility] 09/01/2013 1:50 PM

## 2013-09-01 NOTE — Patient Instructions (Signed)
YOU HAD AN ENDOSCOPIC PROCEDURE TODAY AT THE [facility]: Refer to the procedure report that was given to you for any specific questions about what was found during the examination.  If the procedure report does not answer your questions, please call your gastroenterologist to clarify.  If you requested that your care partner not be given the details of your procedure findings, then the procedure report has been included in a sealed envelope for you to review at your convenience later.  YOU SHOULD EXPECT: Some feelings of bloating in the abdomen. Passage of more gas than usual.  Walking can help get rid of the air that was put into your GI tract during the procedure and reduce the bloating. If you had a lower endoscopy (such as a colonoscopy or flexible sigmoidoscopy) you may notice spotting of blood in your stool or on the toilet paper. If you underwent a bowel prep for your procedure, then you may not have a normal bowel movement for a few days.  DIET: Your first meal following the procedure should be a light meal and then it is ok to progress to your normal diet.  A half-sandwich or bowl of soup is an example of a good first meal.  Heavy or fried foods are harder to digest and may make you feel nauseous or bloated.  Likewise meals heavy in dairy and vegetables can cause extra gas to form and this can also increase the bloating.  Drink plenty of fluids but you should avoid alcoholic beverages for 24 hours.  ACTIVITY: Your care partner should take you home directly after the procedure.  You should plan to take it easy, moving slowly for the rest of the day.  You can resume normal activity the day after the procedure however you should NOT DRIVE or use heavy machinery for 24 hours (because of the sedation medicines used during the test).    SYMPTOMS TO REPORT IMMEDIATELY: A gastroenterologist can be reached at any hour.  During normal business hours, 8:30 AM to 5:00 PM Monday through Friday,  call [phone].  After hours and on weekends, please call the GI answering service at [phone] who will take a message and have the physician on call contact you.   Following lower endoscopy (colonoscopy or flexible sigmoidoscopy):  Excessive amounts of blood in the stool  Significant tenderness or worsening of abdominal pains  Swelling of the abdomen that is new, acute  Fever of 100F or higher  FOLLOW UP: If any biopsies were taken you will be contacted by phone or by letter within the next 1-3 weeks.  Call your gastroenterologist if you have not heard about the biopsies in 3 weeks.  Our staff will call the home number listed on your records the next business day following your procedure to check on you and address any questions or concerns that you may have at that time regarding the information given to you following your procedure. This is a courtesy call and so if there is no answer at the home number and we have not heard from you through the emergency physician on call, we will assume that you have returned to your regular daily activities without incident.  SIGNATURES/CONFIDENTIALITY: You and/or your care partner have signed paperwork which will be entered into your electronic medical record.  These signatures attest to the fact that that the information above on your After Visit Summary has been reviewed and is understood.  Full responsibility of the confidentiality of this   discharge information lies with you and/or your care-partner.   Resume medications. Information given on hemorrhoids and high fiber diet with discharge instructions. 

## 2013-09-01 NOTE — Progress Notes (Signed)
Report to PACU, RN, vss, BBS= Clear.  

## 2013-09-02 ENCOUNTER — Telehealth: Payer: Self-pay | Admitting: *Deleted

## 2013-09-02 NOTE — Telephone Encounter (Signed)
  Follow up Call-  Call back number 09/01/2013 06/28/2013  Post procedure Call Back phone  # [phone]  Permission to leave phone message Yes Yes     Patient questions:  Do you have a fever, pain , or abdominal swelling? no Pain Score  0 *  Have you tolerated food without any problems? yes  Have you been able to return to your normal activities? yes  Do you have any questions about your discharge instructions: Diet   no Medications  no Follow up visit  no  Do you have questions or concerns about your Care? no  Actions: * If pain score is 4 or above: No action needed, pain <4.

## 2014-10-25 ENCOUNTER — Encounter (HOSPITAL_COMMUNITY): Payer: Self-pay | Admitting: Emergency Medicine

## 2014-10-25 ENCOUNTER — Emergency Department (HOSPITAL_COMMUNITY): Payer: Self-pay

## 2014-10-25 ENCOUNTER — Emergency Department (HOSPITAL_COMMUNITY)
Admission: EM | Admit: 2014-10-25 | Discharge: 2014-10-25 | Disposition: A | Discharge status: Home / Self Care | Payer: Self-pay | Attending: Emergency Medicine | Admitting: Emergency Medicine

## 2014-10-25 DIAGNOSIS — W19XXXA Unspecified fall, initial encounter: Secondary | ICD-10-CM | POA: Insufficient documentation

## 2014-10-25 DIAGNOSIS — S3991XA Unspecified injury of abdomen, initial encounter: Secondary | ICD-10-CM | POA: Insufficient documentation

## 2014-10-25 DIAGNOSIS — Z3202 Encounter for pregnancy test, result negative: Secondary | ICD-10-CM | POA: Insufficient documentation

## 2014-10-25 DIAGNOSIS — S20211A Contusion of right front wall of thorax, initial encounter: Secondary | ICD-10-CM | POA: Insufficient documentation

## 2014-10-25 DIAGNOSIS — Y998 Other external cause status: Secondary | ICD-10-CM | POA: Insufficient documentation

## 2014-10-25 DIAGNOSIS — Y939 Activity, unspecified: Secondary | ICD-10-CM | POA: Insufficient documentation

## 2014-10-25 DIAGNOSIS — T148XXA Other injury of unspecified body region, initial encounter: Secondary | ICD-10-CM

## 2014-10-25 DIAGNOSIS — Y929 Unspecified place or not applicable: Secondary | ICD-10-CM | POA: Insufficient documentation

## 2014-10-25 DIAGNOSIS — T148 Other injury of unspecified body region: Secondary | ICD-10-CM | POA: Insufficient documentation

## 2014-10-25 LAB — CBC WITH DIFFERENTIAL/PLATELET
BASOS ABS: 0 10*3/uL (ref 0.0–0.1)
BASOS PCT: 0 % (ref 0–1)
EOS PCT: 3 % (ref 0–5)
Eosinophils Absolute: 0.2 10*3/uL (ref 0.0–0.7)
HCT: 34.7 % — ABNORMAL LOW (ref 36.0–46.0)
HEMOGLOBIN: 11.5 g/dL — AB (ref 12.0–15.0)
LYMPHS ABS: 1.6 10*3/uL (ref 0.7–4.0)
Lymphocytes Relative: 27 % (ref 12–46)
MCH: 28.8 pg (ref 26.0–34.0)
MCHC: 33.1 g/dL (ref 30.0–36.0)
MCV: 87 fL (ref 78.0–100.0)
MONO ABS: 0.5 10*3/uL (ref 0.1–1.0)
MONOS PCT: 8 % (ref 3–12)
Neutro Abs: 3.8 10*3/uL (ref 1.7–7.7)
Neutrophils Relative %: 62 % (ref 43–77)
PLATELETS: 257 10*3/uL (ref 150–400)
RBC: 3.99 MIL/uL (ref 3.87–5.11)
RDW: 13.1 % (ref 11.5–15.5)
WBC: 6 10*3/uL (ref 4.0–10.5)

## 2014-10-25 LAB — URINALYSIS, ROUTINE W REFLEX MICROSCOPIC
Bilirubin Urine: NEGATIVE
Glucose, UA: NEGATIVE mg/dL
KETONES UR: NEGATIVE mg/dL
LEUKOCYTES UA: NEGATIVE
NITRITE: NEGATIVE
Protein, ur: NEGATIVE mg/dL
Specific Gravity, Urine: 1.025 (ref 1.005–1.030)
Urobilinogen, UA: 0.2 mg/dL (ref 0.0–1.0)
pH: 6 (ref 5.0–8.0)

## 2014-10-25 LAB — URINE MICROSCOPIC-ADD ON

## 2014-10-25 LAB — BASIC METABOLIC PANEL
ANION GAP: 7 (ref 5–15)
BUN: 11 mg/dL (ref 6–20)
CALCIUM: 9.2 mg/dL (ref 8.9–10.3)
CO2: 24 mmol/L (ref 22–32)
CREATININE: 0.51 mg/dL (ref 0.44–1.00)
Chloride: 105 mmol/L (ref 101–111)
GFR calc non Af Amer: >60 mL/min (ref >60)
Glucose, Bld: 107 mg/dL — ABNORMAL HIGH (ref 65–99)
Potassium: 4.3 mmol/L (ref 3.5–5.1)
SODIUM: 136 mmol/L (ref 135–145)

## 2014-10-25 LAB — PREGNANCY, URINE: PREG TEST UR: NEGATIVE

## 2014-10-25 MED ORDER — NAPROXEN 500 MG PO TABS: 500.0000 mg | ORAL_TABLET | Freq: Two times a day (BID) | ORAL | Status: AC

## 2014-10-25 NOTE — ED Notes (Signed)
Pt states that she has been having right flank pain since yesterday.

## 2014-10-25 NOTE — ED Notes (Signed)
Patient with no complaints at this time. Respirations even and unlabored. Skin warm/dry. Discharge instructions reviewed with patient at this time. Patient given opportunity to voice concerns/ask questions. Patient discharged at this time and left Emergency Department with steady gait.   

## 2014-10-25 NOTE — ED Provider Notes (Signed)
CSN: [id number]     Arrival date & time 10/25/14  1014 History   First MD Initiated Contact with Patient 10/25/14 1019     Chief Complaint  Patient presents with  . Flank Pain     (Consider location/radiation/quality/duration/timing/severity/associated sxs/prior Treatment) HPI   Margot AblesLaura Drolet is a 39 y.o. female who presents to the Emergency Department complaining of right upper back and flank pain for one day.  She reports an aching pain to her side that she describes as "deep" and worse with movement.  She was seen at the health dept yesterday and told that the provider "heard something in my lung" and was started on a medication that has not helped her symptoms.  She denies shortness of breath, fever, recent illness, abdominal pain, nausea, dysuria, or vomiting.  She also reports a fall to her side approximately two weeks ago.     History reviewed. No pertinent past medical history. History reviewed. No pertinent past surgical history. History reviewed. No pertinent family history. History  Substance Use Topics  . Smoking status: Never Smoker   . Smokeless tobacco: Not on file  . Alcohol Use: No   OB History    No data available     Review of Systems  Constitutional: Negative for fever, chills and appetite change.  HENT: Negative for congestion.   Respiratory: Negative for chest tightness and shortness of breath.   Cardiovascular: Positive for chest pain. Negative for palpitations.       Right rib pain  Gastrointestinal: Negative for nausea, vomiting, abdominal pain and blood in stool.  Genitourinary: Positive for flank pain. Negative for dysuria, hematuria, decreased urine volume and difficulty urinating.  Musculoskeletal: Positive for back pain. Negative for neck pain.  Skin: Negative for color change and rash.  Neurological: Negative for dizziness, weakness and numbness.  Hematological: Negative for adenopathy.  All other systems reviewed and are  negative.     Allergies  Ranitidine  Home Medications   Prior to Admission medications   Not on File   BP 130/85 mmHg  Pulse 93  Temp(Src) 98.6 F (37 C)  Resp 20  Ht 5' (1.524 m)  Wt 164 lb (74.39 kg)  BMI 32.03 kg/m2  SpO2 100% Physical Exam  Constitutional: She is oriented to person, place, and time. She appears well-developed and well-nourished. No distress.  HENT:  Head: Normocephalic and atraumatic.  Neck: Normal range of motion. Neck supple.  Cardiovascular: Normal rate, regular rhythm, normal heart sounds and intact distal pulses.   No murmur heard. Pulmonary/Chest: Effort normal and breath sounds normal. No respiratory distress. She exhibits tenderness.  Tenderness to palpation right lateral and posterior ribs.  No ecchymosis, crepitus or splinting on exam.  Abdominal: Soft. She exhibits no distension and no mass. There is no tenderness. There is no rebound and no guarding.  Musculoskeletal: Normal range of motion.  Lymphadenopathy:    She has no cervical adenopathy.  Neurological: She is alert and oriented to person, place, and time. She exhibits normal muscle tone. Coordination normal.  Skin: Skin is warm and dry. No rash noted.  Psychiatric: She has a normal mood and affect.  Nursing note and vitals reviewed.   ED Course  Procedures (including critical care time) Labs Review Labs Reviewed  URINALYSIS, ROUTINE W REFLEX MICROSCOPIC (NOT AT [facility]) - Abnormal; Notable for the following:    Hgb urine dipstick TRACE (*)    All other components within normal limits  URINE MICROSCOPIC-ADD ON - Abnormal; Notable for  the following:    Squamous Epithelial / LPF FEW (*)    Bacteria, UA MANY (*)    All other components within normal limits  BASIC METABOLIC PANEL - Abnormal; Notable for the following:    Glucose, Bld 107 (*)    All other components within normal limits  CBC WITH DIFFERENTIAL/PLATELET - Abnormal; Notable for the following:    Hemoglobin 11.5 (*)     HCT 34.7 (*)    All other components within normal limits  PREGNANCY, URINE    Imaging Review Dg Ribs Unilateral W/chest Right  10/25/2014   CLINICAL DATA:  39 year old female with a history of pain in the right posterior thorax  EXAM: RIGHT RIBS AND CHEST - 3+ VIEW  COMPARISON:  None.  FINDINGS: Cardiomediastinal silhouette within normal limits.  No pleural effusion or pneumothorax.  No confluent airspace disease.  No displaced fracture, with attention to the right-sided ribs.  Surgical changes of cholecystectomy.  IMPRESSION: No radiographic evidence of acute cardiopulmonary disease.  No displaced fracture identified, with attention to the right-sided ribs.  Signed,  Yvone Neu. Loreta Ave, DO  [facility]  [facility]   Electronically Signed   By: Gilmer Mor D.O.   On: 10/25/2014 12:08     EKG Interpretation   Date/Time:  Wednesday October 25 2014 11:04:52 EDT Ventricular Rate:  87 PR Interval:  187 QRS Duration: 86 QT Interval:  375 QTC Calculation: 451 R Axis:   80 Text Interpretation:  Sinus rhythm No old tracing to compare Confirmed by  CAMPOS  MD, Caryn Bee ([id number]) on 10/25/2014 1:43:28 PM      MDM   Final diagnoses:  Muscle strain  Rib contusion, right, initial encounter    Vitals remain stable and patient is resting comfortably.  Pain is likely related to muscular injury given history of previous fall.  Pt appears stable for d/c and agrees to symptomatic tx and she was given strict return precautions.      Pauline Aus, PA-C 10/27/14 1425  Azalia Bilis, MD 10/30/14 [phone]

## 2014-10-25 NOTE — Discharge Instructions (Signed)
Contusin en el trax  (Chest Contusion)  Una contusin es un hematoma profundo. Las contusiones ocurren cuando una lesin provoca un sangrado debajo de la piel. Los signos de hematoma son dolor, inflamacin (hinchazn) y cambio de color en la piel. La zona de la contusin puede ponerse [place], [place] o [place].  CUIDADOS EN EL HOGAR   Aplique hielo sobre la zona lesionada.  Ponga el hielo en una bolsa plstica.  Colquese una toalla entre la piel y la bolsa de hielo.  Deje el hielo durante 15 a 20 minutos por vez 3 a 4 veces por da, durante las primeras 48 horas.  Slo debe tomar la medicacin segn las indicaciones del mdico.  Haga reposo.  Respire profundamente (ejercicios de respiracin profunda) segn lo indicado por su mdico.  Si fuma, abandone el hbito.  No levante objetos de ms de 5 libras (2.3 kg) durante 3 das o ms si se lo indica su mdico. SOLICITE AYUDA DE INMEDIATO SI:   Tiene ms moretones o hinchazn.  Siente un dolor que [place].  Tiene dificultad para respirar.  Se siente mareado, dbil o se desmaya (se desvanece).  Observa sangre en el pis (orina) o en la materia fecal (heces).  Tose o vomita sangre.  La hinchazn o el dolor no se [employer]. ASEGRESE DE QUE:   Comprende estas instrucciones.  Controlar su enfermedad.  Solicitar ayuda de inmediato si no mejora o si empeora. Document Released: 12/17/2011 [facility] Patient Information 2015 [place], [place]. This information is not intended to replace advice given to you by your health care provider. Make sure you discuss any questions you have with your health care provider.  Distensin muscular. (Muscle Strain) Una distensin muscular es una lesin que se produce cuando un msculo se estira ms all de su largo normal. Cuando esto sucede, por lo general se desgarra un pequeo nmero de fibras musculares. La distensin muscular se califica en grados. Las distensiones de Environmental education officer son aquellas en las cuales el desgarro y el dolor afectan a la menor cantidad de fibras musculares. Las distensiones de segundo y tercer grado involucran una proporcin cada vez mayor de desgarro y Engineer, mining.  En general, la recuperacin de una distensin muscular tarda de 1 a 2semanas. La curacin completa tarda de 5 a 6semanas.  CAUSAS  Las distensiones musculares ocurren cuando se aplica una fuerza violenta y repentina sobre un msculo y este se estira demasiado. Esto puede ocurrir cuando se Hydrographic surveyor, se practican deportes o en una cada.  FACTORES DE RIESGO La distensin muscular es especialmente comn en los atletas.  SIGNOS Y SNTOMAS En el lugar de la distensin muscular se puede presentar lo siguiente:  Dolor.  Moretones.  Hinchazn.  Dificultad para usar el msculo debido al dolor o a un funcionamiento anormal. DIAGNSTICO  El mdico le har un examen fsico y le har preguntas sobre sus antecedentes mdicos. TRATAMIENTO  Con frecuencia, el mejor tratamiento para una distensin muscular es el reposo, y la aplicacin de hielo y de compresas fras en la zona de la lesin.  INSTRUCCIONES PARA EL CUIDADO EN EL HOGAR   Use el mtodo PRICE (por sus siglas en ingls) de tratamiento para estimular la curacin durante los primeros 2 a 3das posteriores a la lesin. El mtodo PRICE implica lo siguiente:  Proteger al msculo de nuevas lesiones.  Limitar la actividad y Lawyer la parte del cuerpo lesionada.  Aplicar hielo a la lesin. Para hacerlo, ponga hielo en [employer]  plstica. Coloque una toalla entre la piel y la bolsa de hielo. Luego aplique el hielo y djelo actuar de 15 a 20minutos por hora. Despus del tercer da, cambie a compresas de calor hmedo.  Comprimir la zona lesionada con una frula o venda elstica.Press photographer Tenga cuidado de no ajustarla demasiado. Esto puede interferir con la circulacin sangunea o aumentar la hinchazn.  Mantener la zona lesionada por  encima del nivel del corazn con la mayor frecuencia posible.  Utilice los medicamentos de venta libre o recetados para Primary school teachercalmar el dolor, el malestar o la fiebre, segn se lo indique el mdico.  Education officer, environmentalealizar un calentamiento antes de hacer ejercicio ayuda a prevenir distensiones musculares futuras. SOLICITE ATENCIN MDICA SI:   Siente un dolor cada vez ms intenso o hinchazn en la zona lesionada.  Siente adormecimiento, hormigueo o nota una prdida importante de fuerza en la zona lesionada. ASEGRESE DE QUE:   Comprende estas instrucciones.  Controlar su afeccin.  Recibir ayuda de inmediato si no mejora o si empeora. Document Released: 01/01/2005 Document Revised: 01/12/2013 [facility] Patient Information 2015 [place]. This information is not intended to replace advice given to you by your health care provider. Make sure you discuss any questions you have with your health care provider.

## 2016-03-24 ENCOUNTER — Other Ambulatory Visit (HOSPITAL_COMMUNITY): Payer: Self-pay | Admitting: Nurse Practitioner

## 2016-03-24 DIAGNOSIS — N852 Hypertrophy of uterus: Secondary | ICD-10-CM

## 2016-03-24 DIAGNOSIS — N920 Excessive and frequent menstruation with regular cycle: Secondary | ICD-10-CM

## 2016-03-25 ENCOUNTER — Other Ambulatory Visit (HOSPITAL_COMMUNITY): Payer: Self-pay | Admitting: Nurse Practitioner

## 2016-03-27 ENCOUNTER — Ambulatory Visit (HOSPITAL_COMMUNITY): Payer: Self-pay

## 2016-04-02 ENCOUNTER — Ambulatory Visit (HOSPITAL_COMMUNITY): Admission: RE | Admit: 2016-04-02 | Payer: Self-pay | Source: Ambulatory Visit

## 2016-04-16 ENCOUNTER — Other Ambulatory Visit: Payer: Self-pay

## 2016-04-16 ENCOUNTER — Encounter: Payer: Self-pay | Admitting: Nurse Practitioner

## 2016-04-16 ENCOUNTER — Ambulatory Visit (INDEPENDENT_AMBULATORY_CARE_PROVIDER_SITE_OTHER): Payer: Self-pay | Admitting: Nurse Practitioner

## 2016-04-16 VITALS — BP 104/60 | HR 82 | Ht 59.0 in | Wt 122.0 lb

## 2016-04-16 DIAGNOSIS — R140 Abdominal distension (gaseous): Secondary | ICD-10-CM

## 2016-04-16 DIAGNOSIS — E538 Deficiency of other specified B group vitamins: Secondary | ICD-10-CM

## 2016-04-16 DIAGNOSIS — R1010 Upper abdominal pain, unspecified: Secondary | ICD-10-CM

## 2016-04-16 NOTE — Progress Notes (Signed)
     HPI: Patient is a 41 year old Spanish speaking female here with her husband who speaks little [place]iaEnglish. Stratus interpretor Kyla Balzarineatiana for interpretation / translation. Patient was seen by Dr. Russella DarStark January 2015 for iron deficiency and LUQ discomfort, gas, bloating, and GERD. Her PPI was changed, she was given a low gas diet and advised to use Gas-X as needed. Upper endoscopy showed atrophic gastritis, normal duodenum. Gastric biopsies c/w moderate, chronic inactive gastritis, no h.pylori. Complete colonoscopy was normal except for small internal hemorrhoids.  Patient hospitalized for uncontrolled HTN in June, says she gas given ASA and has had upper GI symptoms since then. She feels bloated, has excessive belching. Bloating not postprandial, she has it in am upon waking up. She is s/p cholecystectomy 6 years ago.   Doesn't take ASA at home. Avoids gas producing foods such as cabbage, broccoli, etc..Marland Kitchen.Doesn't use artifical sweetners. She has reports  27 pounds loss since June . At one time she took bentyl, carafate and protonix, When stmach started bothering her again in June she got refills but this time that haven't helped. Reglan on home list but patient doesn't know anything about the medicine.     Past Medical History:  Diagnosis Date  . Anemia   . Gastritis   . Hypertension     Patient's surgical history, family medical history, social history, medications and allergies were all reviewed in Epic    Physical Exam: BP 104/60   Pulse 82   Ht 4\' 11"  (1.499 m)   Wt 122 lb (55.3 kg)   BMI 24.64 kg/m   GENERAL: Well developed Hispanic female in NAD. Here with husband PSYCH: :Pleasant, cooperative, normal affect HEENT: Normocephalic, conjunctiva pink, mucous membranes moist, neck supple without masses CARDIAC:  RRR, no murmur heard, no peripheral edema PULM: Normal respiratory effort, lungs CTA bilaterally, no wheezing ABDOMEN:  soft, nontender, nondistended, no obvious masses, no  hepatomegaly,  normal bowel sounds SKIN:  turgor, no lesions seen Musculoskeletal:  Normal muscle tone, normal strength NEURO: Alert and oriented x 3, no focal neurologic deficits  ASSESSMENT and PLAN:  301. 41 year old Non-English speaking female with LUQ pain / bloating and weight loss since June (saw us in 2015 with bloating and LUQ pain).  -trial of FDguard -Gas-x prn -return to clinic in 3-4 weeks, or sooner if worsening symptoms -If no improvement then will needed further workup (probably imaging studies).   3. Hx of iron deficiency anemia, still on daily iron. Iron normal in late December per patient.  4. B12 deficiency. B12 was 76 in year 2015. Intrinsic factor antibody and ordered but I can't see where it was actually done. Not started on supplement at the time (per patient). B12 apparently rechecked late December and still low. Now on daily B12.   -obtain parietal cell antibody and intrinsic factor antibody   Willette ClusterPaula Guenther , NP 04/16/2016, 11:00 AM

## 2016-04-16 NOTE — Patient Instructions (Signed)
Please go to the basement level to have your labs drawn.  We have given you samples of FD Delene RuffiniGard and a coupon. You can get this at [facility] or [facility].   Take Gas-X as needed for gas.   We made you an appointment with Willette ClusterPaula Guenther NP for 05-08-2016  Dolor abdominal en adultos (Abdominal Pain, Adult) El dolor de estmago (abdominal) puede tener muchas causas. La mayora de las veces, el dolor de [place] no es peligroso. Muchos de Franklin Resourcesestos casos de dolor de estmago pueden controlarse y tratarse en casa. CUIDADOS EN EL HOGAR   No tome medicamentos que lo ayuden a defecar (laxantes), salvo que su mdico se lo indique.  Solo tome los medicamentos que le haya indicado su mdico.  Coma o beba lo que le indique su mdico. Su mdico le dir si debe seguir una dieta especial. SOLICITE AYUDA SI:  No sabe cul es la causa del dolor de [place].  Tiene dolor de estmago cuando siente ganas de vomitar (nuseas) o tiene colitis (diarrea).  Tiene dolor durante la miccin o la evacuacin.  El dolor de estmago lo despierta de noche.  Tiene dolor de Mirantestmago que empeora o [place]ora cuando come.  Tiene dolor de Mirantestmago que empeora cuando come CIGNAalimentos grasosos.  Tiene fiebre. SOLICITE AYUDA DE INMEDIATO SI:   El dolor no desaparece en un plazo mximo de 2horas.  No deja de (vomitar).  El dolor cambia y se Librarian, academiclocaliza solo en la parte derecha o izquierda del [place]mago.  La materia fecal es sanguinolenta o de aspecto alquitranado. ASEGRESE DE QUE:   Comprende estas instrucciones.  Controlar su afeccin.  Recibir ayuda de inmediato si no mejora o si empeora. Esta informacin no tiene Theme park managercomo fin reemplazar el consejo del mdico. Asegrese de hacerle al mdico cualquier pregunta que tenga. Document Released: 06/20/2008 Document Revised: 04/14/2014 Elsevier Interactive Patient Education  2017 [employer]

## 2016-04-17 NOTE — Progress Notes (Signed)
Reviewed and agree with initial management plan.  Tyon Cerasoli T. Sherlock Nancarrow, MD FACG 

## 2016-04-18 LAB — ANTI-PARIETAL ANTIBODY: Parietal Cell Antibody-IgG: NEGATIVE

## 2016-04-21 LAB — INTRINSIC FACTOR ANTIBODIES: Intrinsic Factor: POSITIVE — AB

## 2016-05-08 ENCOUNTER — Ambulatory Visit (INDEPENDENT_AMBULATORY_CARE_PROVIDER_SITE_OTHER): Payer: Self-pay | Admitting: Physician Assistant

## 2016-05-08 ENCOUNTER — Encounter: Payer: Self-pay | Admitting: Physician Assistant

## 2016-05-08 VITALS — BP 108/60 | HR 66 | Ht 59.0 in | Wt 122.0 lb

## 2016-05-08 DIAGNOSIS — R1084 Generalized abdominal pain: Secondary | ICD-10-CM

## 2016-05-08 DIAGNOSIS — E538 Deficiency of other specified B group vitamins: Secondary | ICD-10-CM

## 2016-05-08 DIAGNOSIS — R140 Abdominal distension (gaseous): Secondary | ICD-10-CM

## 2016-05-08 MED ORDER — PANTOPRAZOLE SODIUM 40 MG PO TBEC: 40.0000 mg | DELAYED_RELEASE_TABLET | Freq: Two times a day (BID) | ORAL | 11 refills | Status: AC

## 2016-05-08 NOTE — Progress Notes (Signed)
Chief Complaint: Bloating , Abdominal Pain  HPI:  Alexandra Carter is a 41 year old Hispanic speaking female who presents to clinic today accompanied by her husband, interpretor is present, who returns to clinic for follow-up of her abdominal pain and bloating.     Please recall patient was last seen in clinic on 04/16/16 and described symptoms of bloating as well as excessive belching. The patient avoided gas producing foods such as cabbage, broccoli, etc. and denied use of  artificial sweeteners. She also reported 27 pound weight loss since June. Patient explained that when she restarted her Bentyl, Carafate and Protonix they did not help recently. The patient was given a trial of FD Guard and told to use Gas-X when necessary and to follow-up in 3-4 weeks. It was also noted that the patient had B12 deficiency, further labs were run including a parietal cell antibody which was normal and intrinsic fanner factor antibody which came back positive, patient was told to follow with her PCP regarding this.   Today, the patient tells me that she continues with symptoms. She notes that she continues to feel bloated and experiences a left upper quadrant abdominal pain. She has been avoiding all the "bad foods", but is still having symptoms. She does not believe FD Guard is helping. She does tell me that she feels some relief from the Gas-X, but not complete. The patient tells me she has one solid bowel movement per day which is a decrease from when she used to have a bowel movement after eating 3 times a day. She tells me that this bowel movement is a difficult one for her to pass. She also continues with abdominal bloating. As well as a lot of belching. She denies any heartburn or reflux. She does tell me that a long time ago she was put on pantoprazole 40 mg daily for these symptoms, but even this is not helping anymore.   Patient does tell me that she has followed with her "clinic" regarding her vitamin B12  deficiency.   Patient denies fever, chills, blood in her stool, melena, fatigue, anorexia, nausea, vomiting, heartburn, reflux or symptoms that awaken her at night.   Past Medical History:  Diagnosis Date  . Anemia   . Bloating   . Gastritis   . Hypertension     Past Surgical History:  Procedure Laterality Date  . CESAREAN SECTION    . CHOLECYSTECTOMY      Current Outpatient Prescriptions  Medication Sig Dispense Refill  . dicyclomine (BENTYL) 10 MG capsule Take one po TID 90 capsule 3  . ferrous fumarate (HEMOCYTE - 106 MG FE) 325 (106 FE) MG TABS tablet Take 1 tablet by mouth.    Marland Kitchen. lisinopril (PRINIVIL,ZESTRIL) 5 MG tablet Take 5 mg by mouth daily.    . Multiple Vitamins-Minerals (MULTIVITAMIN PO) Take by mouth.    . pantoprazole (PROTONIX) 40 MG tablet Take 1 tablet (40 mg total) by mouth daily. 30 tablet 11  . sucralfate (CARAFATE) 1 GM/10ML suspension Take 1 gm po TID 420 mL 3   No current facility-administered medications for this visit.     Allergies as of 05/08/2016 - Review Complete 05/08/2016  Allergen Reaction Noted  . Other Other (See Comments) 04/16/2013  . Zantac [ranitidine hcl]  06/14/2013    Family History  Problem Relation Age of Onset  . Heart attack Father   . Hypertension Mother   . Anemia Mother   . Heart disease Paternal Grandfather   . Colon cancer  Neg Hx     Social History   Social History  . Marital status: Married    Spouse name: N/A  . Number of children: 2  . Years of education: N/A   Occupational History  . Not on file.   Social History Main Topics  . Smoking status: Never Smoker  . Smokeless tobacco: Never Used  . Alcohol use No  . Drug use: No  . Sexual activity: Yes   Other Topics Concern  . Not on file   Social History Narrative  . No narrative on file    Review of Systems:    Constitutional: No weight loss, fever or chills Cardiovascular: No chest pain Respiratory: No SOB  Gastrointestinal: See HPI and  otherwise negative   Physical Exam:  Vital signs: BP 108/60   Pulse 66   Ht 4\' 11"  (1.499 m)   Wt 122 lb (55.3 kg)   BMI 24.64 kg/m   Constitutional:   Pleasant Hispanic female appears to be in NAD, Well developed, Well nourished, alert and cooperative  Respiratory: Respirations even and unlabored. Lungs clear to auscultation bilaterally.   No wheezes, crackles, or rhonchi.  Cardiovascular: Normal S1, S2. No MRG. Regular rate and rhythm. No peripheral edema, cyanosis or pallor.  Gastrointestinal:  Soft, nondistended, moderate epigastric and left upper quadrant tenderness No rebound or guarding. Normal bowel sounds. No appreciable masses or hepatomegaly.  RELEVANT LABS AND IMAGING: CBC    Component Value Date/Time   WBC 5.3 04/16/2013 1050   RBC 3.85 (L) 04/16/2013 1050   HGB 9.7 (L) 04/16/2013 1050   HCT 31.0 (L) 04/16/2013 1050   PLT 333 04/16/2013 1050   MCV 80.5 04/16/2013 1050   MCH 25.2 (L) 04/16/2013 1050   MCHC 31.3 04/16/2013 1050   RDW 16.5 (H) 04/16/2013 1050   LYMPHSABS 1.2 04/16/2013 1050   MONOABS 0.4 04/16/2013 1050   EOSABS 0.2 04/16/2013 1050   BASOSABS 0.0 04/16/2013 1050    CMP     Component Value Date/Time   NA 139 04/16/2013 1050   K 3.9 04/16/2013 1050   CL 104 04/16/2013 1050   CO2 22 04/16/2013 1050   GLUCOSE 90 04/16/2013 1050   BUN 8 04/16/2013 1050   CREATININE 0.55 04/16/2013 1050   CALCIUM 8.7 04/16/2013 1050   PROT 7.3 04/16/2013 1050   ALBUMIN 4.0 04/16/2013 1050   AST 17 04/16/2013 1050   ALT 11 04/16/2013 1050   ALKPHOS 60 04/16/2013 1050   BILITOT 0.3 04/16/2013 1050   GFRNONAA >90 04/16/2013 1050   GFRAA >90 04/16/2013 1050    Assessment: 1. Abdominal pain: Continues per patient,  left upper quadrant and epigastrium, initially this was better with pantoprazole 40 mg "a while ago"; consider relation to gas pain versus IBS versus gastritis versus other 2. Bloating: With above, some better with Gas-X but not entirely  relieved 3. Vitamin B12 deficiency: Intrinsic factor antibody was recently positive, question of pernicious anemia from Lucrezia Europe, NP, recommend they follow with PCP regarding this  Plan: 1. Recommend the patient continue her Gas-X as needed 2. Recommend the patient start MiraLAX once daily. We did discuss that she can use this up to 4 times daily titrated to a regular soft easy to pass bowel movement 3. Increased patient's Pantoprazole to 40 mg twice a day, 30-60 minutes before eating breakfast and dinner 4. Did discuss with the patient that we will send results from recent vitamin B12 testing to her primary care clinic. She should  follow with her primary care doctors regarding this 5. Patient should continue FD Guard for now 6. Discussed that we will call the patient in a week, if she has had no resolution of her symptoms at all, we will order a CT of abdomen and pelvis with contrast for further evaluation of her bloating and abdominal pain (NOTE: PT WILL NEED SPANISH SPEAKER) 7. Patient was scheduled for an appointment with Dr. Russella Dar for follow-up   Hyacinth Meeker, PA-C [facility] 05/08/2016, 9:35 AM  Cc: No ref. provider found

## 2016-05-08 NOTE — Patient Instructions (Addendum)
We will contact you in one week to check on symptoms update.   Start over the counter Miralax once daily.   Increase your pantoprazole 40 mg twice daily. A new prescription has been sent to your pharmacy.   Please follow up with your primary care physician regarding your vitamin b12 level anemia. We will send notes from today and labs to there office.

## 2016-05-08 NOTE — Progress Notes (Signed)
Reviewed and agree with initial management plan.  Zaire Vanbuskirk T. Haedyn Breau, MD FACG 

## 2016-05-09 ENCOUNTER — Encounter: Payer: Self-pay | Admitting: Physician Assistant

## 2016-05-13 ENCOUNTER — Ambulatory Visit (HOSPITAL_COMMUNITY)
Admission: RE | Admit: 2016-05-13 | Discharge: 2016-05-13 | Disposition: A | Discharge status: Home / Self Care | Payer: Self-pay | Source: Ambulatory Visit | Attending: Nurse Practitioner | Admitting: Nurse Practitioner

## 2016-05-13 DIAGNOSIS — N852 Hypertrophy of uterus: Secondary | ICD-10-CM | POA: Insufficient documentation

## 2016-05-13 DIAGNOSIS — N888 Other specified noninflammatory disorders of cervix uteri: Secondary | ICD-10-CM | POA: Insufficient documentation

## 2016-05-13 DIAGNOSIS — N920 Excessive and frequent menstruation with regular cycle: Secondary | ICD-10-CM | POA: Insufficient documentation

## 2016-05-15 ENCOUNTER — Telehealth: Payer: Self-pay

## 2016-05-15 DIAGNOSIS — R109 Unspecified abdominal pain: Secondary | ICD-10-CM

## 2016-05-15 DIAGNOSIS — R140 Abdominal distension (gaseous): Secondary | ICD-10-CM

## 2016-05-15 NOTE — Telephone Encounter (Signed)
Alexandra Carter you order CT abdomen/pelvis for the patient with contrast for further eval of abdominal pain and bloating. Thanks  Vladimir Croftssmeralda, would you mind calling the patient and telling her what we plan to do? She should continue meds that she is on currently until after imaging. Thanks-JLL

## 2016-05-15 NOTE — Telephone Encounter (Signed)
Please call pt and get a condition update on abdominal pain and bloating.  She was to start pantoprazole twice daily and miralax once daily.  Thanks

## 2016-05-15 NOTE — Telephone Encounter (Signed)
Alexandra Carter, [facility] called the pt and she agrees to CT scan.    You have been scheduled for a CT scan of the abdomen and pelvis at [place] ([address]---this is in the same building as Press photographer).   You are scheduled on 05/23/16 at 130 pm. You should arrive 15 minutes prior to your appointment time for registration. Please follow the written instructions below on the day of your exam:  WARNING: IF YOU ARE ALLERGIC TO IODINE/X-RAY DYE, PLEASE NOTIFY RADIOLOGY IMMEDIATELY AT [id number]! YOU WILL BE GIVEN A 13 HOUR PREMEDICATION PREP.  1) Do not eat or drink anything after 930 am (4 hours prior to your test) 2) You have been given 2 bottles of oral contrast to drink. The solution may taste               better if refrigerated, but do NOT add ice or any other liquid to this solution. Shake             well before drinking.    Drink 1 bottle of contrast @ 1130 am (2 hours prior to your exam)  Drink 1 bottle of contrast @ 1230 pm (1 hour prior to your exam)  You may take any medications as prescribed with a small amount of water except for the following: Metformin, Glucophage, Glucovance, Avandamet, Riomet, Fortamet, Actoplus Met, Janumet, Glumetza or Metaglip. The above medications must be held the day of the exam AND 48 hours after the exam.  The purpose of you drinking the oral contrast is to aid in the visualization of your intestinal tract. The contrast solution may cause some diarrhea. Before your exam is started, you will be given a small amount of fluid to drink. Depending on your individual set of symptoms, you may also receive an intravenous injection of x-ray contrast/dye. Plan on being at [facility] for 30 minutes or longer, depending on the type of exam you are having performed.  This test typically takes 30-45 minutes to complete.  If you have any questions regarding your exam or if you need to reschedule, you may call the CT department at [phone]  between the hours of 8:00 am and 5:00 pm, Monday-Friday.  ________________________________________________________

## 2016-05-15 NOTE — Telephone Encounter (Signed)
Erie [facility]eVanessa [facility] pt and explained the instructions and advised her to come to the office and pick up contrast and written instructions.  All questions answered and pt verbalized understanding

## 2016-05-15 NOTE — Telephone Encounter (Addendum)
-----   Message from Jessee AversAmanda L Lewellyn, CMA sent at 05/08/2016 10:10 AM EST ----- Call with symptom update. Saw Jennifer in the office. She does not speak [place]English and needs a Bahrain[place] interpreter.

## 2016-05-15 NOTE — Telephone Encounter (Signed)
Es, [facility] the pt and she states that she continues to have the same symptoms but it is some better.  She says the bloating is the same.  Please advise

## 2016-05-23 ENCOUNTER — Ambulatory Visit (INDEPENDENT_AMBULATORY_CARE_PROVIDER_SITE_OTHER)
Admission: RE | Admit: 2016-05-23 | Discharge: 2016-05-23 | Disposition: A | Discharge status: Home / Self Care | Payer: Self-pay | Source: Ambulatory Visit | Attending: Physician Assistant | Admitting: Physician Assistant

## 2016-05-23 DIAGNOSIS — R140 Abdominal distension (gaseous): Secondary | ICD-10-CM

## 2016-05-23 DIAGNOSIS — R109 Unspecified abdominal pain: Secondary | ICD-10-CM

## 2016-05-23 MED ORDER — IOPAMIDOL (ISOVUE-300) INJECTION 61%
100.0000 mL | Freq: Once | INTRAVENOUS | Status: AC | PRN
  Administered 2016-05-23: 100 mL via INTRAVENOUS

## 2016-05-26 ENCOUNTER — Telehealth: Payer: Self-pay | Admitting: Physician Assistant

## 2016-05-26 NOTE — Telephone Encounter (Signed)
[name] the pt and advise her to call her PCP and have them prescribe the B12 as a prescription and have them administer the injection in the office.  She will call back if they still will not accommodate.

## 2016-06-17 ENCOUNTER — Ambulatory Visit: Payer: Self-pay | Admitting: Gastroenterology

## 2016-07-23 ENCOUNTER — Encounter (INDEPENDENT_AMBULATORY_CARE_PROVIDER_SITE_OTHER): Payer: Self-pay

## 2016-07-23 ENCOUNTER — Ambulatory Visit (INDEPENDENT_AMBULATORY_CARE_PROVIDER_SITE_OTHER): Payer: Self-pay | Admitting: Gastroenterology

## 2016-07-23 ENCOUNTER — Encounter: Payer: Self-pay | Admitting: Gastroenterology

## 2016-07-23 VITALS — BP 132/80 | HR 80 | Ht 59.0 in | Wt 125.0 lb

## 2016-07-23 DIAGNOSIS — R140 Abdominal distension (gaseous): Secondary | ICD-10-CM

## 2016-07-23 DIAGNOSIS — K5900 Constipation, unspecified: Secondary | ICD-10-CM

## 2016-07-23 DIAGNOSIS — R1012 Left upper quadrant pain: Secondary | ICD-10-CM

## 2016-07-23 NOTE — Patient Instructions (Signed)
You can take IB Gard 2 capsules by mouth three times a day.   Take over the counter Gas-x three times a day for gas and bloating.   You have been given a low gas diet.   Do not use milk products.   Thank you for choosing me and [facility].  Venita Lick. Pleas Koch., MD., Clementeen Graham

## 2016-07-23 NOTE — Progress Notes (Signed)
    History of Present Illness: This is a 41 year old female accompanied by her husband. Both are Spanish speaking and translation is provided by a live video feed service. Patient has had persistent problems with left upper quadrant pain, gas and bloating. She feels that pantoprazole provides some benefit as does Gas-X. She was taking Gas-X 3 times a day and now she reduced it to once daily. She does not feel dicyclomine or Carafate has helped her symptoms. She continues to have constipation with a incomplete evacuations on most days. States MiraLAX was helpful and she discontinued taking it.  Current Medications, Allergies, Past Medical History, Past Surgical History, Family History and Social History were reviewed in [employer] record.  Physical Exam: General: Well developed, well nourished, no acute distress Head: Normocephalic and atraumatic Eyes:  sclerae anicteric, EOMI Ears: Normal auditory acuity Mouth: No deformity or lesions Lungs: Clear throughout to auscultation Heart: Regular rate and rhythm; no murmurs, rubs or bruits Abdomen: Soft, non tender and non distended. No masses, hepatosplenomegaly or hernias noted. Normal Bowel sounds Musculoskeletal: Symmetrical with no gross deformities  Pulses:  Normal pulses noted Extremities: No clubbing, cyanosis, edema or deformities noted Neurological: Alert oriented x 4, grossly nonfocal Psychological:  Alert and cooperative. Normal mood and affect  Assessment and Recommendations:  1. LUQ pain, gas, bloating, mild constipation. Continue pantoprazole 40 mg daily. Resume MiraLAX once daily. Resume Gas-X QID ac and hs. Begin IBGard 2 to TID ac. Low gas diet. DC dicyclomine and Carafate. Consider Trulance or Linzess if symptoms are not significantly improved with above regimen. Patient is advised to call if her symptoms do not improve within 1 month.

## 2017-08-01 IMAGING — CT CT ABD-PELV W/ CM
2 of 5 series · 16 of 46 positions shown, 18 images · IV contrast (ISOVUE 300)
Comparison: None.

CLINICAL DATA: Seven month history of abdominal pain with increased
gas, bloating and early satiety.

EXAM:
CT ABDOMEN AND PELVIS WITH CONTRAST
TECHNIQUE: Multidetector CT imaging of the abdomen and pelvis was performed
using the standard protocol following bolus administration of
intravenous contrast.
CONTRAST:  100mL [id number] IOPAMIDOL ([id number]) INJECTION 61%

[Series 2: abd/pel w · axial · 0.67mm/px · z∈[-451,-76]mm · 13 of 85 slices shown, 15 images]
[im 5/85  soft-tissue]
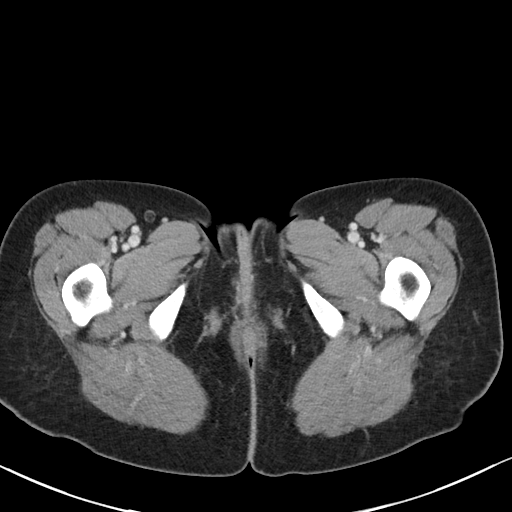
[im 5/85  bone]
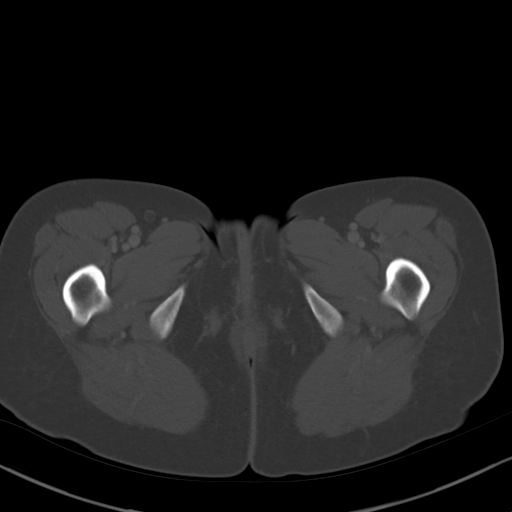
[im 14/85  soft-tissue]
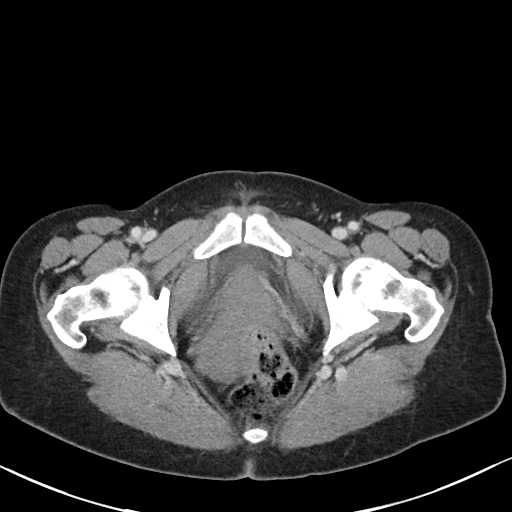
[im 18/85  soft-tissue]
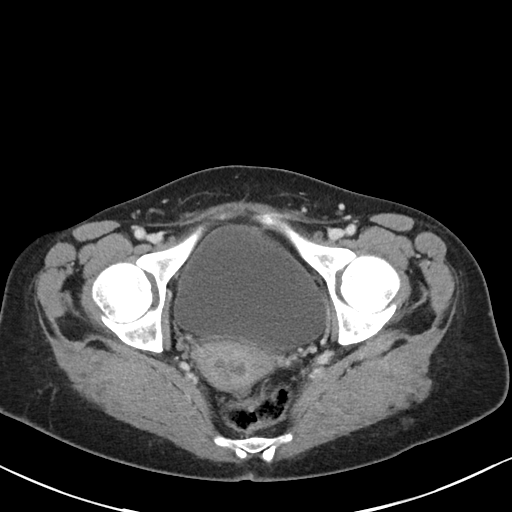
[im 23/85  soft-tissue]
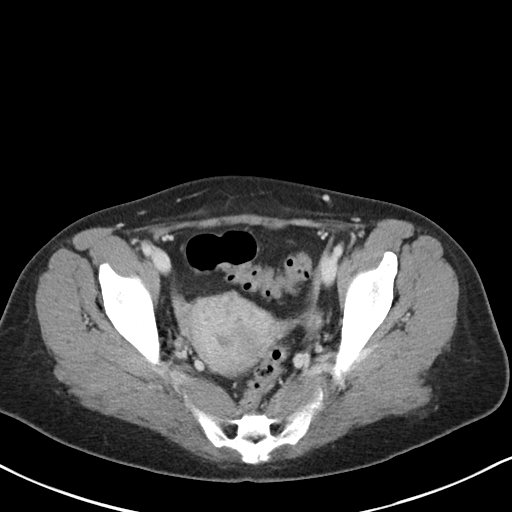
[im 31/85  soft-tissue]
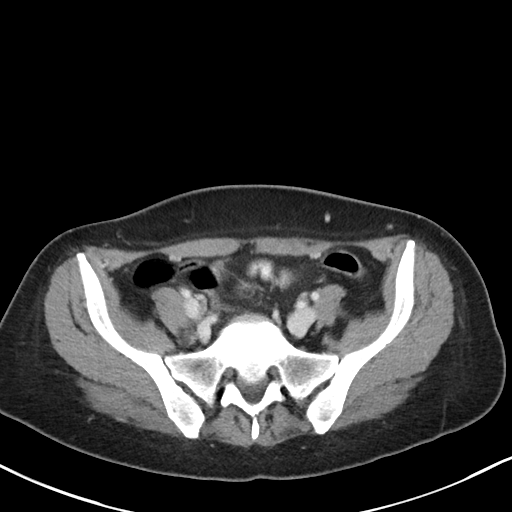
[im 36/85  soft-tissue]
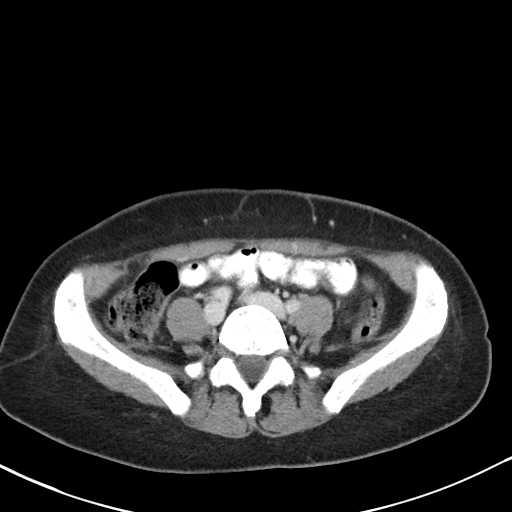
[im 45/85  soft-tissue]
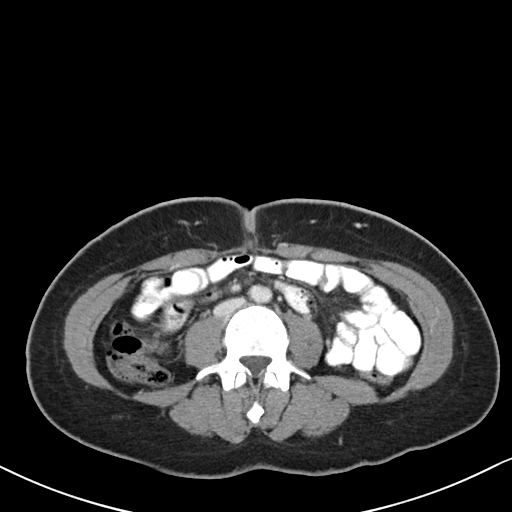
[im 49/85  soft-tissue]
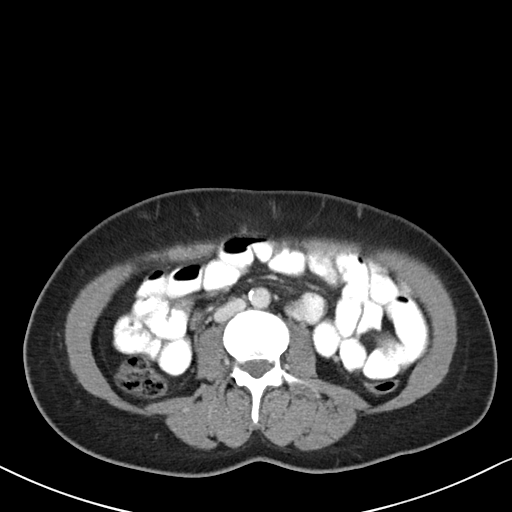
[im 54/85  soft-tissue]
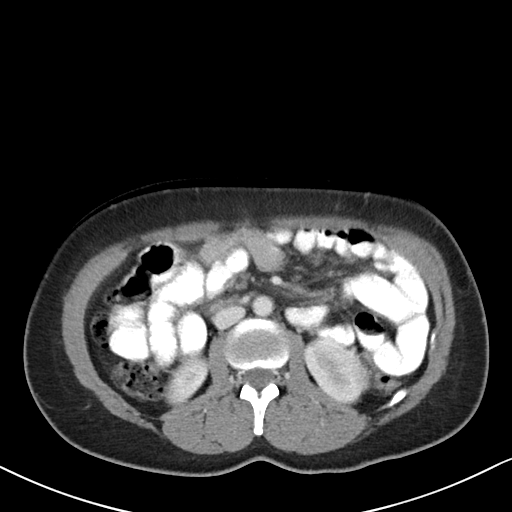
[im 54/85  bone]
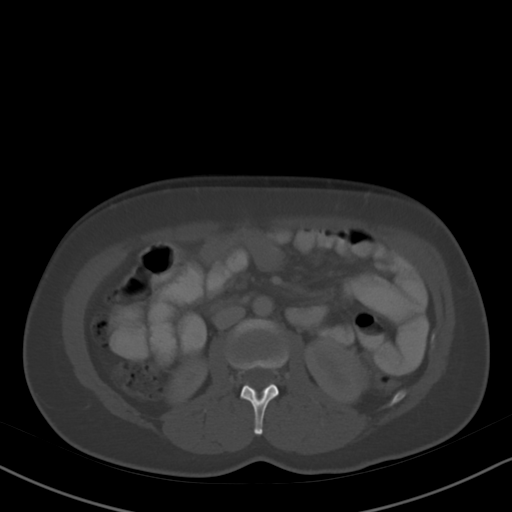
[im 62/85  soft-tissue]
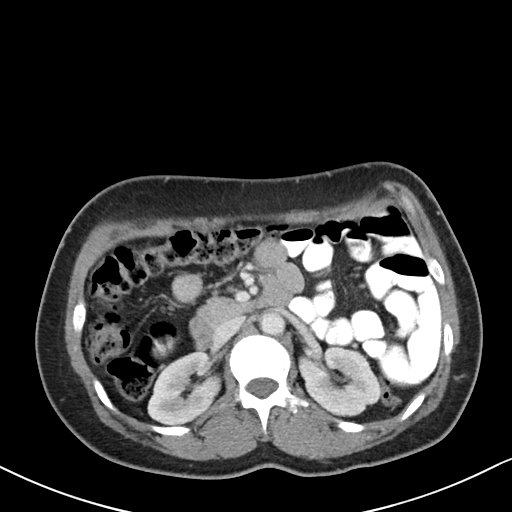
[im 67/85  soft-tissue]
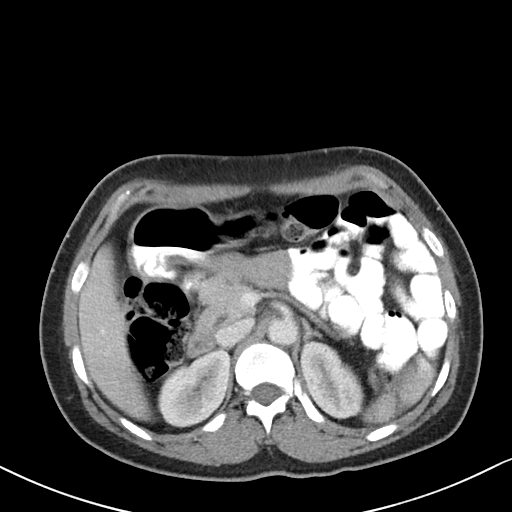
[im 71/85  soft-tissue]
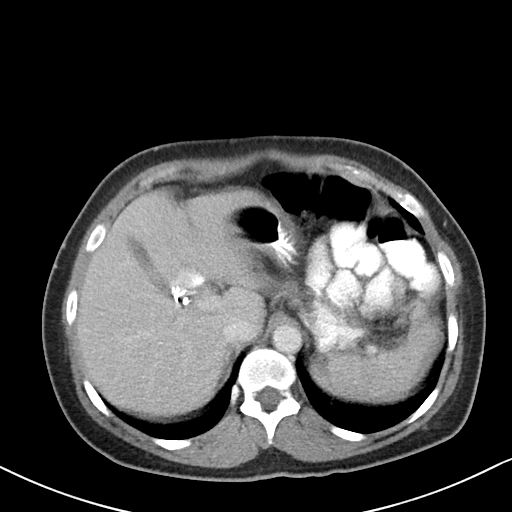
[im 80/85  soft-tissue]
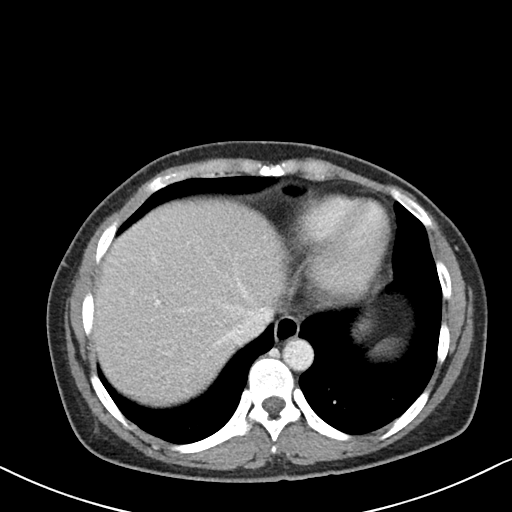

[Series 6: abd/pel w st · coronal · 0.60mm/px · 3 of 74 slices shown]
[im 25/74  soft-tissue]
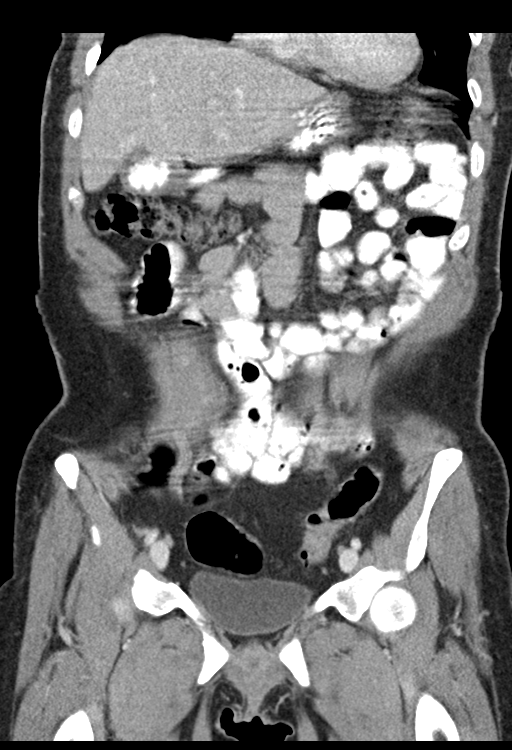
[im 33/74  soft-tissue]
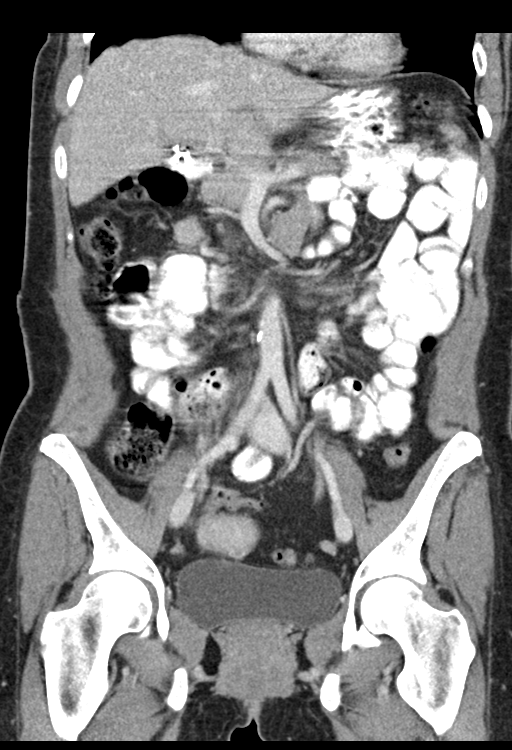
[im 41/74  soft-tissue]
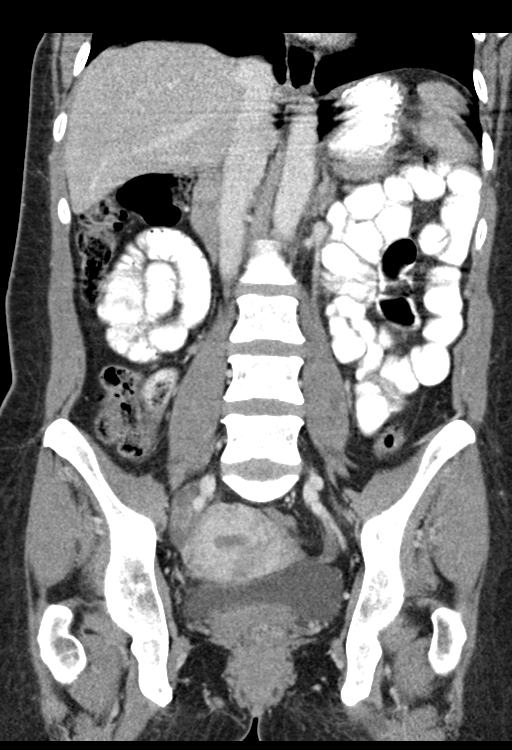

[16 of 46 positions shown; findings below may reference images not displayed]

FINDINGS: Patient motion somewhat degrades the imaging.

Lower chest: Clear lung bases.  Heart normal size.

Hepatobiliary: No focal liver abnormality is seen. Status post
cholecystectomy. No biliary dilatation.

Pancreas: Unremarkable. No pancreatic ductal dilatation or
surrounding inflammatory changes.

Spleen: Normal in size without focal abnormality.

Adrenals/Urinary Tract: Adrenal glands are unremarkable. Kidneys are
normal, without renal calculi, focal lesion, or hydronephrosis.
Bladder is unremarkable.

Stomach/Bowel: Stomach is within normal limits. Appendix appears
normal. No evidence of bowel wall thickening, distention, or
inflammatory changes. There are few left colon diverticula.

Vascular/Lymphatic: No significant vascular findings are present. No
enlarged abdominal or pelvic lymph nodes.

Reproductive: Uterus and bilateral adnexa are unremarkable.

Other: No abdominal wall hernia or abnormality. No abdominopelvic
ascites.

Musculoskeletal: Unremarkable.
IMPRESSION: 1. No acute findings. No findings to account for this patient's
symptoms.
2. Status post cholecystectomy. There are few left colon
diverticula. No diverticulitis or other bowel abnormality.

## 2018-07-17 IMAGING — US US TRANSVAGINAL NON-OB
1 series · 13 of 25 positions shown · non-contrast
Comparison: None in PACs

CLINICAL DATA: Heavy periods, enlarged uterus.

EXAM:
TRANSABDOMINAL AND TRANSVAGINAL ULTRASOUND OF PELVIS
TECHNIQUE: Both transabdominal and transvaginal ultrasound examinations of the
pelvis were performed. Transabdominal technique was performed for
global imaging of the pelvis including uterus, ovaries, adnexal
regions, and pelvic cul-de-sac. It was necessary to proceed with
endovaginal exam following the transabdominal exam to visualize the
uterus and ovaries

[Series 1: us transvaginal non-ob · 0.24mm/px · 13 of 110 slices shown]
[im 1/110]
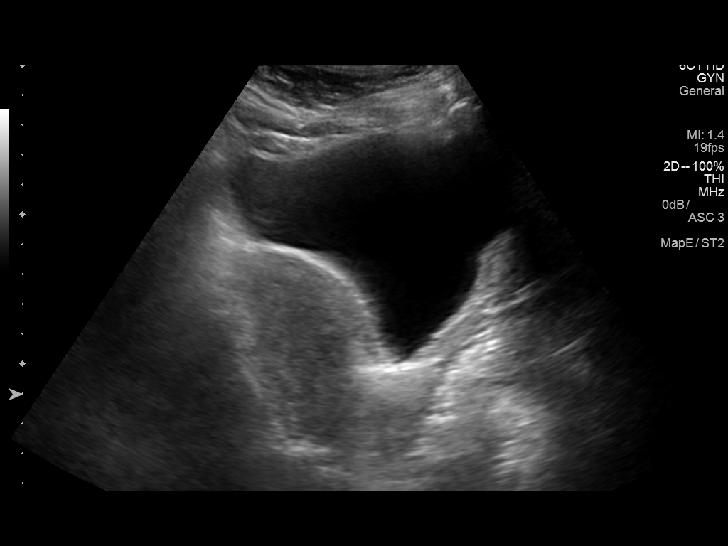
[im 10/110]
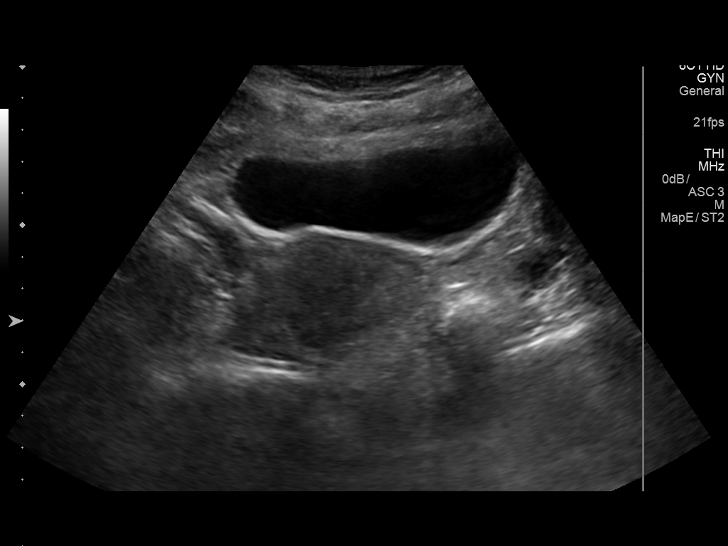
[im 19/110]
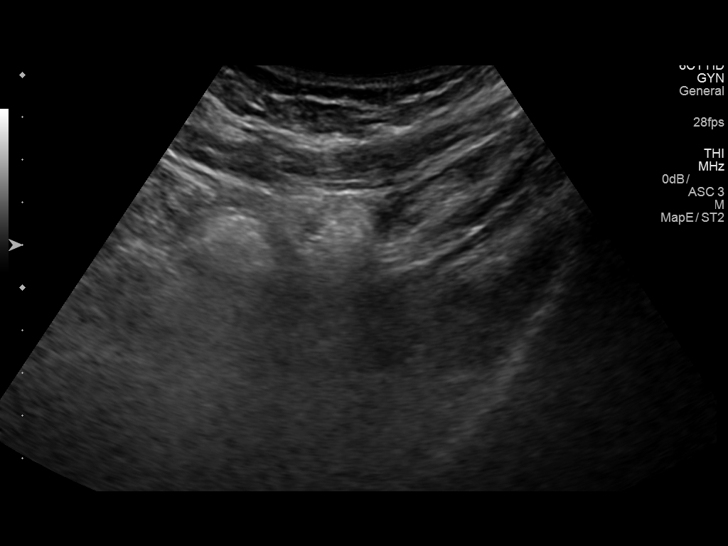
[im 28/110]
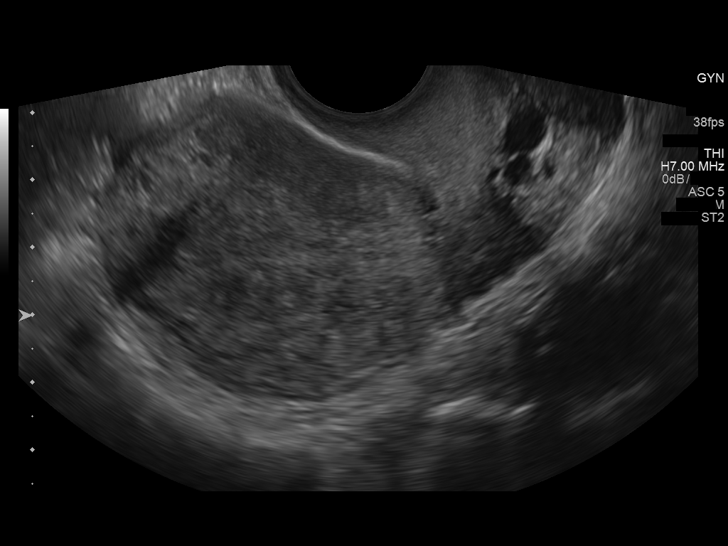
[im 37/110]
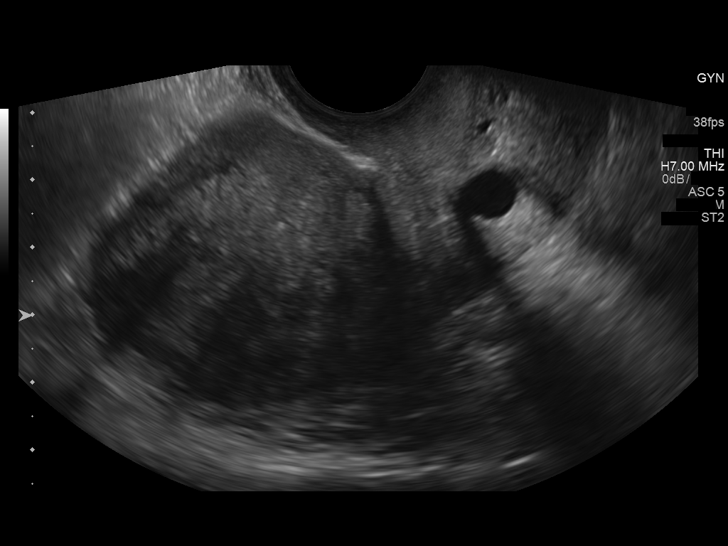
[im 46/110]
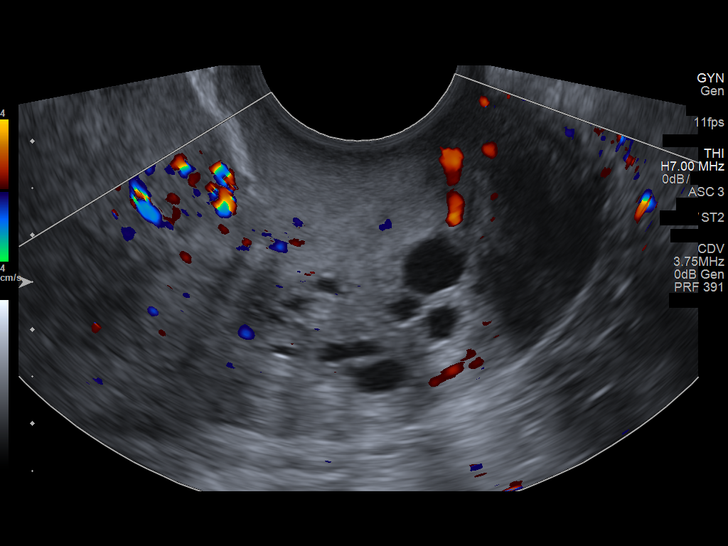
[im 55/110]
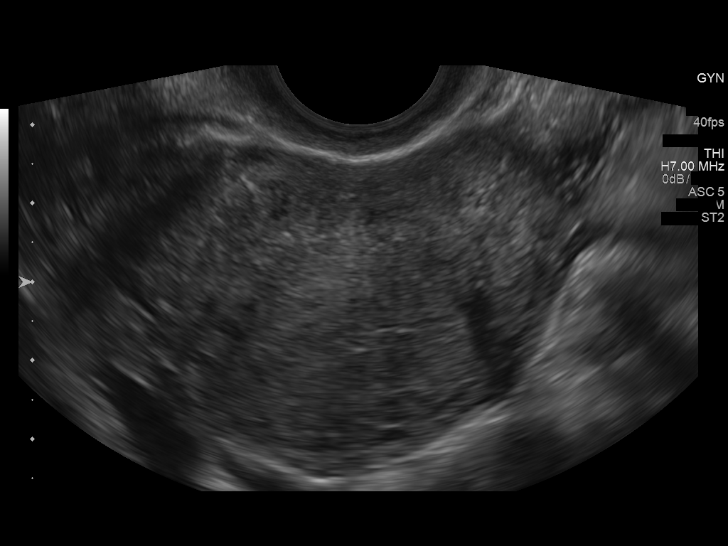
[im 64/110]
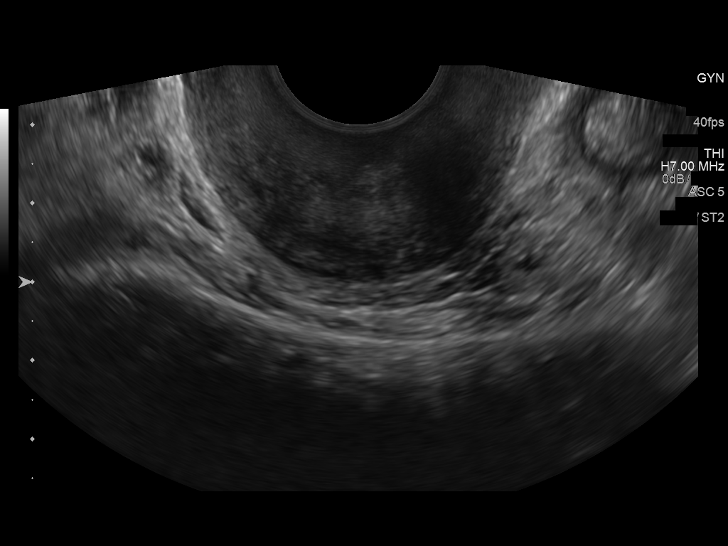
[im 73/110]
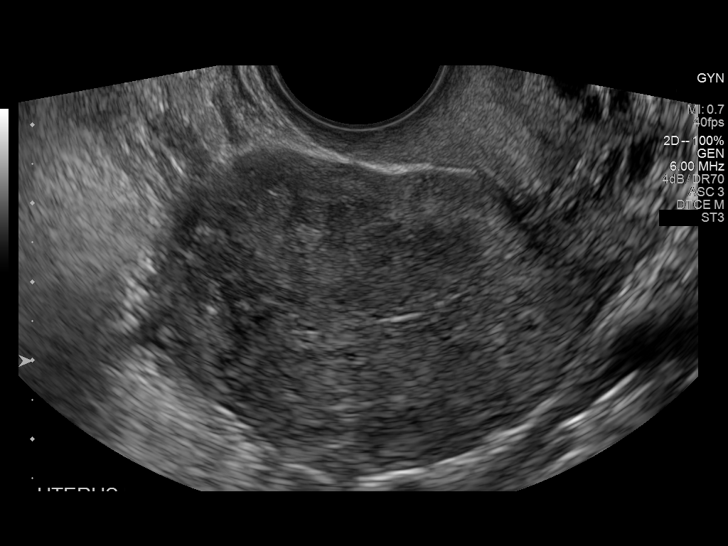
[im 82/110]
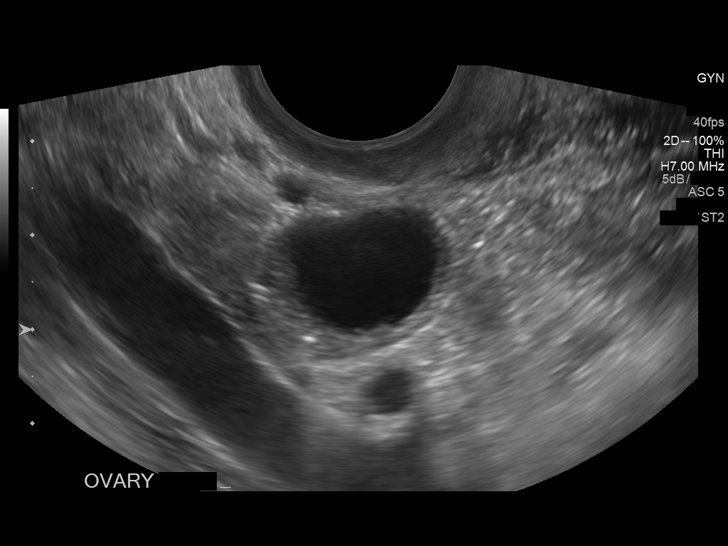
[im 91/110]
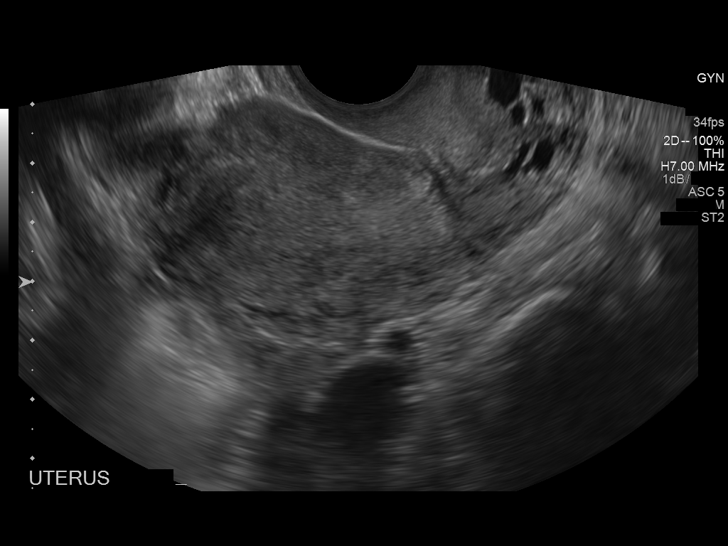
[im 100/110]
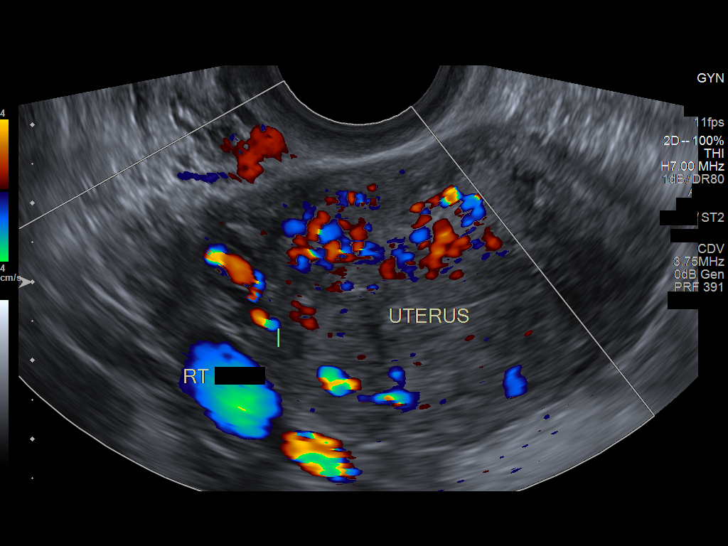
[im 110/110]
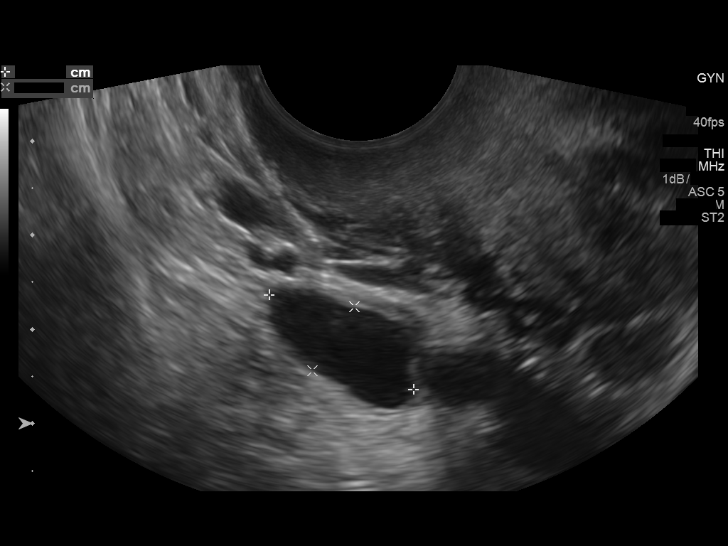

[13 of 25 positions shown; findings below may reference images not displayed]

FINDINGS: Uterus

Measurements: 7.6 x 4.3 x 5.6 cm. The myometrial echotexture is
normal. There is cystic changes in the lower uterine segment-cervix
most compatible with multiple nabothian cysts. The largest measures
1 cm in diameter.

Endometrium

Thickness: 4 mm. No endometrial mass or fluid collection is
observed.

Right ovary

Measurements: 2.5 x 1.2 x 2.0 cm. There are developing follicles
with the largest measuring 1.8 cm in diameter. There are no adnexal
masses.

Left ovary

Measurements: 2.3 x 1.5 x 2.4 cm. There are developing follicles
with 1 measuring as much as 2 cm in diameter. There are no adnexal
masses.

Other findings

There is no free pelvic fluid.
IMPRESSION: Normal uterine size and contour. Prominent cyst in the lower uterine
segment -cervix likely reflecting nabothian cysts. Normal
endometrial thickness. If bleeding remains unresponsive to hormonal
or medical therapy, sonohysterogram should be considered for focal
lesion work-up. (Ref: Radiological Reasoning: Algorithmic Workup of
Abnormal Vaginal Bleeding with Endovaginal Sonography and
Sonohysterography. AJR 1888; 191:S68-73)

Developing follicles within both ovaries. No suspicious masses in
the adnexal regions. No free fluid.
